# Patient Record
Sex: Male | Born: 1955 | Race: White | Hispanic: No | Marital: Married | Smoking: Never smoker
Health system: Southern US, Community
[De-identification: ages and names within clinical notes are randomized; demographics above are authoritative.]

## PROBLEM LIST (undated history)

## (undated) DIAGNOSIS — K429 Umbilical hernia without obstruction or gangrene: Secondary | ICD-10-CM

## (undated) DIAGNOSIS — Z86010 Personal history of colonic polyps: Secondary | ICD-10-CM

## (undated) DIAGNOSIS — Z8616 Personal history of COVID-19: Secondary | ICD-10-CM

## (undated) DIAGNOSIS — I10 Essential (primary) hypertension: Secondary | ICD-10-CM

## (undated) DIAGNOSIS — K648 Other hemorrhoids: Secondary | ICD-10-CM

## (undated) DIAGNOSIS — D649 Anemia, unspecified: Secondary | ICD-10-CM

## (undated) DIAGNOSIS — Z860101 Personal history of adenomatous and serrated colon polyps: Secondary | ICD-10-CM

## (undated) DIAGNOSIS — K5790 Diverticulosis of intestine, part unspecified, without perforation or abscess without bleeding: Secondary | ICD-10-CM

## (undated) DIAGNOSIS — K649 Unspecified hemorrhoids: Secondary | ICD-10-CM

## (undated) DIAGNOSIS — K642 Third degree hemorrhoids: Secondary | ICD-10-CM

## (undated) DIAGNOSIS — E785 Hyperlipidemia, unspecified: Secondary | ICD-10-CM

## (undated) DIAGNOSIS — K219 Gastro-esophageal reflux disease without esophagitis: Secondary | ICD-10-CM

## (undated) DIAGNOSIS — D126 Benign neoplasm of colon, unspecified: Secondary | ICD-10-CM

## (undated) DIAGNOSIS — K449 Diaphragmatic hernia without obstruction or gangrene: Secondary | ICD-10-CM

## (undated) DIAGNOSIS — D509 Iron deficiency anemia, unspecified: Secondary | ICD-10-CM

## (undated) DIAGNOSIS — Z8719 Personal history of other diseases of the digestive system: Secondary | ICD-10-CM

## (undated) DIAGNOSIS — E7800 Pure hypercholesterolemia, unspecified: Secondary | ICD-10-CM

## (undated) HISTORY — DX: Unspecified hemorrhoids: K64.9

## (undated) HISTORY — DX: Benign neoplasm of colon, unspecified: D12.6

## (undated) HISTORY — PX: HEMORRHOID BANDING: SHX5850

## (undated) HISTORY — DX: Pure hypercholesterolemia, unspecified: E78.00

## (undated) HISTORY — DX: Iron deficiency anemia, unspecified: D50.9

## (undated) HISTORY — PX: TONSILLECTOMY AND ADENOIDECTOMY: SUR1326

## (undated) HISTORY — DX: Essential (primary) hypertension: I10

## (undated) HISTORY — DX: Anemia, unspecified: D64.9

## (undated) HISTORY — DX: Diverticulosis of intestine, part unspecified, without perforation or abscess without bleeding: K57.90

## (undated) HISTORY — DX: Gastro-esophageal reflux disease without esophagitis: K21.9

## (undated) HISTORY — DX: Other hemorrhoids: K64.8

## (undated) HISTORY — PX: COLONOSCOPY WITH PROPOFOL: SHX5780

## (undated) HISTORY — PX: ESOPHAGOGASTRODUODENOSCOPY (EGD) WITH PROPOFOL: SHX5813

---

## 2004-12-15 HISTORY — PX: BACK SURGERY: SHX140

## 2004-12-15 HISTORY — PX: LUMBAR LAMINECTOMY: SHX95

## 2014-11-22 ENCOUNTER — Encounter: Payer: Self-pay | Admitting: Nurse Practitioner

## 2014-12-05 ENCOUNTER — Encounter: Payer: Self-pay | Admitting: *Deleted

## 2014-12-06 ENCOUNTER — Ambulatory Visit (INDEPENDENT_AMBULATORY_CARE_PROVIDER_SITE_OTHER): Payer: PRIVATE HEALTH INSURANCE | Admitting: Nurse Practitioner

## 2014-12-06 ENCOUNTER — Encounter: Payer: Self-pay | Admitting: Nurse Practitioner

## 2014-12-06 VITALS — BP 130/84 | HR 66 | Ht 71.5 in | Wt 219.8 lb

## 2014-12-06 DIAGNOSIS — D509 Iron deficiency anemia, unspecified: Secondary | ICD-10-CM

## 2014-12-06 DIAGNOSIS — K625 Hemorrhage of anus and rectum: Secondary | ICD-10-CM

## 2014-12-06 DIAGNOSIS — K648 Other hemorrhoids: Secondary | ICD-10-CM

## 2014-12-06 MED ORDER — HYDROCORTISONE ACETATE 25 MG RE SUPP: 25.0000 mg | Freq: Two times a day (BID) | RECTAL | Status: DC

## 2014-12-06 NOTE — Patient Instructions (Signed)
We sent a prescription to [facility], [address], [place], [place]. Marland Kitchen Anusol HC suppoitories., Use 1 suppository twice daily for 10 days.  Take a stool softner daily. Example:  Colace.

## 2014-12-08 DIAGNOSIS — K648 Other hemorrhoids: Secondary | ICD-10-CM | POA: Insufficient documentation

## 2014-12-08 DIAGNOSIS — K625 Hemorrhage of anus and rectum: Secondary | ICD-10-CM | POA: Insufficient documentation

## 2014-12-08 DIAGNOSIS — D509 Iron deficiency anemia, unspecified: Secondary | ICD-10-CM | POA: Insufficient documentation

## 2014-12-08 NOTE — Progress Notes (Signed)
HPI :  Patient is a 58 year old male referred by PCP at [facility] in [place] for rectal bleeding. He reports a normal colonoscopy in 2013 in [place].  Patient presents today for evaluation of recurrent painless rectal bleeding with BMs. He hasn't treated the hemorrhoids with anything. No constipation / diarrhea. No other GI complaints but has been having having some SOB and palpitations and scheduled for a stress test. His GI physician in [place] has now retired.   Labs December 2015: Hgb 11.7, MCV 80, ferritin 6.. Renal function normal    Past Medical History  Diagnosis Date  . Hemorrhoids     Onset approximately 1 yr ago  . Elevated cholesterol      Past Surgical History  Procedure Laterality Date  . Back surgery  2006  . Tonsillectomy and adenoidectomy      as a child    Family History  Problem Relation Age of Onset  . Diabetes Father   . Heart disease Mother   . Lupus Father     Antocoagulant disorder  . Colon cancer Neg Hx    History  Substance Use Topics  . Smoking status: Never Smoker   . Smokeless tobacco: Never Used  . Alcohol Use: 0.0 oz/week    0 Not specified per week     Comment: occ   Current Outpatient Prescriptions  Medication Sig Dispense Refill  . ferrous sulfate 325 (65 FE) MG tablet Take 325 mg by mouth 2 (two) times daily with a meal.    . hydrocortisone (ANUSOL-HC) 25 MG suppository Place 1 suppository (25 mg total) rectally 2 (two) times daily. 20 suppository 0   No current facility-administered medications for this visit.   No Known Allergies   Review of Systems: Fatigue, heart rhythm changes, shortness of breath. ll systems reviewed and negative except where noted in HPI.   Physical Exam: BP 130/84 mmHg  Pulse 66  Ht 5' 11.5" (1.816 m)  Wt 219 lb 12.8 oz (99.701 kg)  BMI 30.23 kg/m2 Constitutional: Pleasant,well-developed, white male in no acute distress. HEENT: Normocephalic and atraumatic. Conjunctivae are normal.  No scleral icterus. Neck supple.  Cardiovascular: Normal rate, regular rhythm.  Pulmonary/chest: Effort normal and breath sounds normal. No wheezing, rales or rhonchi. Abdominal: Soft, nondistended, nontender. Bowel sounds active throughout. There are no masses palpable. No hepatomegaly. Rectal: slightly narrow internal anal sphincter. On anoscopy there was an inflamed, friable mass, probably hemorrhoidal tissue.  Extremities: no edema Lymphadenopathy: No cervical adenopathy noted. Neurological: Alert and oriented to person place and time. Skin: Skin is warm and dry. No rashes noted. Psychiatric: Normal mood and affect. Behavior is normal.   ASSESSMENT AND PLAN:   20. 58 year old male with painless rectal bleeding with BM. Suspect bleeding is from internal hemorrhoids. On anoscopy was what appeared to be inflamed, friable hemorrhoid. Will treat with anusol HC supp bid for one week. Patient will return for brief follow up in a couple of weeks for reassessment. . In the interim will request colonoscopy reports from [place]. See #2  2. Mild Iron deficiency anemia of unclear etiology. .  Unusual, though not impossible for hemorrhoidal bleeding to cause iron deficiency.  Await colonoscopy report. Depending on results (and clinical course) patient may need repeat colonoscopy or at least a flex sig for reevaluation of the bleeding if stable from cardiac standpoing. If this does prove to  internal hemorrhoids patient may be a candidate for banding. Will discuss further at follow  up visit.    3. Palpitations / SOB. He is for stress test in near future.

## 2014-12-11 NOTE — Progress Notes (Signed)
Nevin Bloodgood, for iron deficiency anemia he ABSOLUTELY WILL need Colon / EGD to r/o significant pathology. Please arrange. Thank you

## 2014-12-26 ENCOUNTER — Telehealth: Payer: Self-pay | Admitting: *Deleted

## 2014-12-26 NOTE — Telephone Encounter (Signed)
I called and LM for this patient. Per Tye Savoy NP, it is time for him to have a colonoscopy and EGD due to his Iron Def anemia, His Ferritin level was low at 6.   I LM asking the patient to please call me back.

## 2014-12-27 NOTE — Telephone Encounter (Signed)
I called and spoke to the patient and he was fine with scheduling the EGD/COLON in the [facility]  With Dr. Henrene Pastor. He is scheduled for a previsit appt for 02-09-2015 at [place] / [facility] [address].  He is scheduled for the procedures in the [place] [facility] for 02-16-2015 at 11:00 am .

## 2015-02-09 ENCOUNTER — Ambulatory Visit (AMBULATORY_SURGERY_CENTER): Payer: Self-pay

## 2015-02-09 VITALS — Ht 72.0 in | Wt 222.0 lb

## 2015-02-09 DIAGNOSIS — D509 Iron deficiency anemia, unspecified: Secondary | ICD-10-CM

## 2015-02-09 MED ORDER — MOVIPREP 100 G PO SOLR: 1.0000 | Freq: Once | ORAL | Status: DC

## 2015-02-09 NOTE — Progress Notes (Signed)
No allergies to eggs or soy No home oxygen No diet/weight loss meds No past problems with anesthesia  No email

## 2015-02-12 ENCOUNTER — Telehealth: Payer: Self-pay | Admitting: Internal Medicine

## 2015-02-12 NOTE — Telephone Encounter (Signed)
Letter faxed per pt request.

## 2015-02-16 ENCOUNTER — Ambulatory Visit (AMBULATORY_SURGERY_CENTER): Payer: PRIVATE HEALTH INSURANCE | Admitting: Internal Medicine

## 2015-02-16 ENCOUNTER — Encounter: Payer: Self-pay | Admitting: Internal Medicine

## 2015-02-16 VITALS — BP 131/81 | HR 64 | Temp 97.7°F | Resp 23 | Ht 71.5 in | Wt 222.0 lb

## 2015-02-16 DIAGNOSIS — K2100 Gastro-esophageal reflux disease with esophagitis, without bleeding: Secondary | ICD-10-CM

## 2015-02-16 DIAGNOSIS — K625 Hemorrhage of anus and rectum: Secondary | ICD-10-CM

## 2015-02-16 DIAGNOSIS — D509 Iron deficiency anemia, unspecified: Secondary | ICD-10-CM

## 2015-02-16 DIAGNOSIS — K921 Melena: Secondary | ICD-10-CM

## 2015-02-16 MED ORDER — SODIUM CHLORIDE 0.9 % IV SOLN: 500.0000 mL | INTRAVENOUS | Status: DC

## 2015-02-16 MED ORDER — OMEPRAZOLE 20 MG PO CPDR: 20.0000 mg | DELAYED_RELEASE_CAPSULE | Freq: Every day | ORAL | Status: DC

## 2015-02-16 MED ORDER — ESOMEPRAZOLE MAGNESIUM 40 MG PO CPDR: 40.0000 mg | DELAYED_RELEASE_CAPSULE | Freq: Every day | ORAL | Status: AC

## 2015-02-16 NOTE — Op Note (Signed)
[place]  [address] [place], [id number]   ENDOSCOPY PROCEDURE REPORT  PATIENT: Corey Berry, Corey Berry  MR#: [id number] BIRTHDATE: [birth year] , 17  yrs. old GENDER: male ENDOSCOPIST: Eustace Quail, MD REFERRED BY:  [facility] in [place]/Office Self, PROCEDURE DATE:  02/16/2015 PROCEDURE:  EGD, diagnostic ASA CLASS:     Class II INDICATIONS:  iron deficiency anemia. MEDICATIONS: Monitored anesthesia care and Propofol 130 mg IV TOPICAL ANESTHETIC: none  DESCRIPTION OF PROCEDURE: After the risks benefits and alternatives of the procedure were thoroughly explained, informed consent was obtained.  The LB [id number] endoscope was introduced through the mouth and advanced to the second portion of the duodenum , Without limitations.  The instrument was slowly withdrawn as the mucosa was fully examined.    EXAM:The esophagus revealed erosive esophagitis in the distal portion (see photograph).  No Barrett's.  Stomach was normal.  The duodenum was normal.  Retroflexed views revealed a hiatal hernia. The scope was then withdrawn from the patient and the procedure completed.  COMPLICATIONS: There were no immediate complications.  ENDOSCOPIC IMPRESSION: 1. GERD with erosive esophagitis 2. Otherwise normal EGD  RECOMMENDATIONS: 1.  Anti-reflux regimen to be followed (information provided by endoscopy nurse today) 2.  Prescribe omeprazole 20 mg daily; #30; 11 refills 3. Return to the care of your primary physicians  REPEAT EXAM:  eSigned:  Eustace Quail, MD 02/16/2015 11:27 AM    CC:The Patient  ; [facility] in [place]

## 2015-02-16 NOTE — Op Note (Signed)
[place]  [address]. [place], [id number]   COLONOSCOPY PROCEDURE REPORT  PATIENT: Corey Berry, Corey Berry  MR#: [id number] BIRTHDATE: [birth year] , 49  yrs. old GENDER: male ENDOSCOPIST: Eustace Quail, MD REFERRED BY:.  [facility] in [place] / Office PROCEDURE DATE:  02/16/2015 PROCEDURE:   Colonoscopy, diagnostic First Screening Colonoscopy - Avg.  risk and is 50 yrs.  old or older - No.  Prior Negative Screening - Now for repeat screening. N/A  History of Adenoma - Now for follow-up colonoscopy & has been > or = to 3 yrs.  N/A  Polyps Removed Today? No.  Recommend repeat exam, <10 yrs? No. ASA CLASS:   Class II INDICATIONS:rectal bleeding and iron deficiency anemia.prior colonoscopies in [place] in 2013 were normal MEDICATIONS: Monitored anesthesia care and Propofol 270 mg IV  DESCRIPTION OF PROCEDURE:   After the risks benefits and alternatives of the procedure were thoroughly explained, informed consent was obtained.  The digital rectal exam revealed external hemorrhoids.   The LB [id number]  endoscope was introduced through the anus and advanced to the cecum, which was identified by both the appendix and ileocecal valve. No adverse events experienced.   The quality of the prep was excellent.  The instrument was then slowly withdrawn as the colon was fully examined.     COLON FINDINGS: There was moderate diverticulosis noted in the sigmoid colon.   The examination was otherwise normal.  Retroflexed views revealed internal hemorrhoids. The time to cecum=1 minutes 37 seconds.  Withdrawal time=8 minutes 30 seconds.  The scope was withdrawn and the procedure completed. COMPLICATIONS: There were no immediate complications.  ENDOSCOPIC IMPRESSION: 1.   Moderate diverticulosis was noted in the sigmoid colon 2.   The examination was otherwise normal 3.  External hemorrhoids as cause for rectal bleeding  RECOMMENDATIONS: 1.  Continue  current colorectal screening recommendations for "routine risk" patients with a repeat colonoscopy in 10 years. 2.  Upper endoscopy today (please see report)  eSigned:  Eustace Quail, MD 02/16/2015 11:23 AM   cc: The Patient    , [facility] in [place]

## 2015-02-16 NOTE — Progress Notes (Signed)
To recovery, report to [facility], RN. VSS.

## 2015-02-16 NOTE — Patient Instructions (Signed)
Discharge instructions given. Handouts on diverticulosis,hemorrhoids and GERD. Resume previous medications. Prescriptions sent into pharmacy. YOU HAD AN ENDOSCOPIC PROCEDURE TODAY AT [facility]:   Refer to the procedure report that was given to you for any specific questions about what was found during the examination.  If the procedure report does not answer your questions, please call your gastroenterologist to clarify.  If you requested that your care partner not be given the details of your procedure findings, then the procedure report has been included in a sealed envelope for you to review at your convenience later.  YOU SHOULD EXPECT: Some feelings of bloating in the abdomen. Passage of more gas than usual.  Walking can help get rid of the air that was put into your GI tract during the procedure and reduce the bloating. If you had a lower endoscopy (such as a colonoscopy or flexible sigmoidoscopy) you may notice spotting of blood in your stool or on the toilet paper. If you underwent a bowel prep for your procedure, you may not have a normal bowel movement for a few days.  Please Note:  You might notice some irritation and congestion in your nose or some drainage.  This is from the oxygen used during your procedure.  There is no need for concern and it should clear up in a day or so.  SYMPTOMS TO REPORT IMMEDIATELY:   Following lower endoscopy (colonoscopy or flexible sigmoidoscopy):  Excessive amounts of blood in the stool  Significant tenderness or worsening of abdominal pains  Swelling of the abdomen that is new, acute  Fever of 100F or higher   Following upper endoscopy (EGD)  Vomiting of blood or coffee ground material  New chest pain or pain under the shoulder blades  Painful or persistently difficult swallowing  New shortness of breath  Fever of 100F or higher  Black, tarry-looking stools  For urgent or emergent issues, a gastroenterologist can be reached  at any hour by calling [phone].   DIET: Your first meal following the procedure should be a small meal and then it is ok to progress to your normal diet. Heavy or fried foods are harder to digest and may make you feel nauseous or bloated.  Likewise, meals heavy in dairy and vegetables can increase bloating.  Drink plenty of fluids but you should avoid alcoholic beverages for 24 hours.  ACTIVITY:  You should plan to take it easy for the rest of today and you should NOT DRIVE or use heavy machinery until tomorrow (because of the sedation medicines used during the test).    FOLLOW UP: Our staff will call the number listed on your records the next business day following your procedure to check on you and address any questions or concerns that you may have regarding the information given to you following your procedure. If we do not reach you, we will leave a message.  However, if you are feeling well and you are not experiencing any problems, there is no need to return our call.  We will assume that you have returned to your regular daily activities without incident.  If any biopsies were taken you will be contacted by phone or by letter within the next 1-3 weeks.  Please call us at [phone] if you have not heard about the biopsies in 3 weeks.    SIGNATURES/CONFIDENTIALITY: You and/or your care partner have signed paperwork which will be entered into your electronic medical record.  These signatures attest to  the fact that that the information above on your After Visit Summary has been reviewed and is understood.  Full responsibility of the confidentiality of this discharge information lies with you and/or your care-partner.

## 2015-02-19 ENCOUNTER — Telehealth: Payer: Self-pay

## 2015-02-19 NOTE — Telephone Encounter (Signed)
  Follow up Call-  Call back number 02/16/2015  Post procedure Call Back phone  # [phone] or [phone]  Permission to leave phone message Yes     Patient questions:  Do you have a fever, pain , or abdominal swelling? No. Pain Score  0 *  Have you tolerated food without any problems? Yes.    Have you been able to return to your normal activities? Yes.    Do you have any questions about your discharge instructions: Diet   No. Medications  No. Follow up visit  No.  Do you have questions or concerns about your Care? No.  Actions: * If pain score is 4 or above: No action needed, pain <4.

## 2020-10-22 ENCOUNTER — Encounter: Payer: Self-pay | Admitting: *Deleted

## 2020-10-23 ENCOUNTER — Encounter: Payer: Self-pay | Admitting: Nurse Practitioner

## 2020-10-23 ENCOUNTER — Ambulatory Visit: Payer: Federal, State, Local not specified - PPO | Admitting: Nurse Practitioner

## 2020-10-23 ENCOUNTER — Other Ambulatory Visit (INDEPENDENT_AMBULATORY_CARE_PROVIDER_SITE_OTHER): Payer: Federal, State, Local not specified - PPO

## 2020-10-23 VITALS — BP 140/80 | HR 83 | Ht 72.0 in | Wt 215.0 lb

## 2020-10-23 DIAGNOSIS — K648 Other hemorrhoids: Secondary | ICD-10-CM

## 2020-10-23 DIAGNOSIS — K644 Residual hemorrhoidal skin tags: Secondary | ICD-10-CM

## 2020-10-23 DIAGNOSIS — K625 Hemorrhage of anus and rectum: Secondary | ICD-10-CM

## 2020-10-23 DIAGNOSIS — D649 Anemia, unspecified: Secondary | ICD-10-CM

## 2020-10-23 LAB — FERRITIN: Ferritin: 5.7 ng/mL — ABNORMAL LOW (ref 22.0–322.0)

## 2020-10-23 MED ORDER — PLENVU 140 G PO SOLR: 1.0000 | Freq: Once | ORAL | 0 refills | Status: AC

## 2020-10-23 MED ORDER — HYDROCORTISONE (PERIANAL) 2.5 % EX CREA: 1.0000 "application " | TOPICAL_CREAM | Freq: Every day | CUTANEOUS | 1 refills | Status: AC

## 2020-10-23 NOTE — Patient Instructions (Signed)
If you are age 62 or older, your body mass index should be between 23-30. Your Body mass index is 29.16 kg/m. If this is out of the aforementioned range listed, please consider follow up with your Primary Care Provider.  If you are age 60 or younger, your body mass index should be between 19-25. Your Body mass index is 29.16 kg/m. If this is out of the aformentioned range listed, please consider follow up with your Primary Care Provider.   Your provider has requested that you go to the basement level for lab work before leaving today. Press "B" on the elevator. The lab is located at the first door on the left as you exit the elevator.   We have sent the following medications to your pharmacy for you to pick up at your convenience: Hydrocortisone cream apply small amount inside rectum nightly for 10 days.  You have been scheduled for a colonoscopy. Please follow written instructions given to you at your visit today.  Please pick up your prep supplies at the pharmacy within the next 1-3 days. If you use inhalers (even only as needed), please bring them with you on the day of your procedure.  Due to recent changes in healthcare laws, you may see the results of your imaging and laboratory studies on MyChart before your provider has had a chance to review them.  We understand that in some cases there may be results that are confusing or concerning to you. Not all laboratory results come back in the same time frame and the provider may be waiting for multiple results in order to interpret others.  Please give [place] 48 hours in order for your provider to thoroughly review all the results before contacting the office for clarification of your results.

## 2020-10-23 NOTE — Progress Notes (Signed)
ASSESSMENT AND PLAN    # Recurrent painless rectal bleeding.  Bleeding started again two years ago. Last colonoscopy was in 2016 ( for bleeding and anemia). Bleeding probably hemorrhoidal but need to exclude colon lesions which may have developed since last colonoscopy.  --Will schedule for colonoscopy. If bleeding turns out to be from internal hemorrhoids then consider hemorrhoidal banding at a later date. Patient will be scheduled for a colonoscopy. The risks and benefits of colonoscopy with possible polypectomy / biopsies were discussed and the patient agrees to proceed.  --Protruding internal hemorrhoids and external hemorrhoids on exam. Trial of Anusol cream PR Q HS x 10 nights.   # Hx of iron deficiency anemia (remote). Patient says he was recently diagnosed with recurrent anemia and started on oral iron but only taking about one tablet a week.  --Hgb 10.5 ( PCP office) mid October. Will check TIBC, Ferritin.   #GERD, asymptomatic on daily Nexium  HISTORY OF PRESENT ILLNESS     Primary Gastroenterologist :Scarlette Shorts, MD   Chief Complaint : rectal bleeding   Corey Berry is a 64 y.o. male with PMH / Routt significant for,  but not necessarily limited to: Hypertension, obesity, hyperlipidemia, GERD, hemorrhoids, iron deficiency anemia.    I saw patient in late 2015 for rectal bleeding felt to be hemorrhoidal. He was also noted to have IDA so EGD and colonoscopy carried out in March 2016. EGD notable for GERD with esophagitis. Colonoscopy showed diverticulosis and external hemorrhoids.   Patient has been referred back by PCP for evaluation for rectal bleeding. Patient says the bleeding we evaluated him for in 2015 stopped following the colonoscopy. For a few years he had no bleeding then the painless bleeding started again ~ 2 years ago.  Blood is red and described as a large amount at times. He notices blood with nearly every BM.  He bleeds more on days that he drinks coffee but  attributes that to coffee's laxative effect.  No constipation. No abdominal or rectal pain. Sometimes he has fecal seepage consisting of clear fluid and /or occasionally blood.   Data Reviewed: PCP labs 10/08/20 LFTs normal Hgb 10.5 Renal function normal   Previous Endoscopic Evaluations / Pertinent Studies:  March 2016 EGD and colonoscopy for rectal bleeding and IDA --GERD with esophagitis --Diverticulosis and external hemorrhoid  Past Medical History:  Diagnosis Date  . Anemia   . Elevated cholesterol   . Hemorrhoids    Onset approximately 1 yr ago  . Hypertension      Past Surgical History:  Procedure Laterality Date  . BACK SURGERY  2006  . TONSILLECTOMY AND ADENOIDECTOMY     as a child   Family History  Problem Relation Age of Onset  . Diabetes Father   . Lupus Father        Antocoagulant disorder  . Heart disease Mother   . Colon cancer Neg Hx   . Stomach cancer Neg Hx    Social History   Tobacco Use  . Smoking status: Never Smoker  . Smokeless tobacco: Never Used  Substance Use Topics  . Alcohol use: Yes    Alcohol/week: 0.0 standard drinks    Comment: occ  . Drug use: No   Current Outpatient Medications  Medication Sig Dispense Refill  . esomeprazole (NEXIUM) 40 MG capsule Take 1 capsule (40 mg total) by mouth daily at 6 (six) AM. 30 capsule 11  . ferrous sulfate 325 (65 FE) MG tablet Take 325  mg by mouth 2 (two) times daily with a meal.    . lisinopril (ZESTRIL) 10 MG tablet Take 10 mg by mouth daily.    . simvastatin (ZOCOR) 20 MG tablet Take 20 mg by mouth daily.     No current facility-administered medications for this visit.   No Known Allergies   Review of Systems: All systems reviewed and negative except where noted in HPI.   PHYSICAL EXAM :    Wt Readings from Last 3 Encounters:  10/23/20 215 lb (97.5 kg)  02/16/15 222 lb (100.7 kg)  02/09/15 222 lb (100.7 kg)    BP 140/80   Pulse 83   Ht 6' (1.829 m)   Wt 215 lb (97.5 kg)    BMI 29.16 kg/m  Constitutional:  Pleasant male in no acute distress. Psychiatric: Normal mood and affect. Behavior is normal. EENT: Pupils normal.  Conjunctivae are normal. No scleral icterus. Neck supple.  Cardiovascular: Normal rate, regular rhythm. No edema Pulmonary/chest: Effort normal and breath sounds normal. No wheezing, rales or rhonchi. Abdominal: Soft, nondistended, nontender. Bowel sounds active throughout. There are no masses palpable. No hepatomegaly. Rectal: inflamed protruding internal hemorrhoids. Mildly inflamed external hemorrhoids.  Neurological: Alert and oriented to person place and time. Skin: Skin is warm and dry. No rashes noted.  Tye Savoy, NP  10/23/2020, 2:08 PM  Cc:  Referring Provider Etter Sjogren, FNP

## 2020-10-23 NOTE — Progress Notes (Signed)
Assessment and plans reviewed  

## 2020-10-24 ENCOUNTER — Telehealth: Payer: Self-pay | Admitting: Nurse Practitioner

## 2020-10-24 LAB — IRON, TOTAL/TOTAL IRON BINDING CAP
%SAT: 3 % (calc) — ABNORMAL LOW (ref 20–48)
Iron: 12 ug/dL — ABNORMAL LOW (ref 50–180)
TIBC: 380 mcg/dL (calc) (ref 250–425)

## 2020-10-29 ENCOUNTER — Encounter: Payer: Self-pay | Admitting: Internal Medicine

## 2020-11-05 ENCOUNTER — Other Ambulatory Visit: Payer: Self-pay

## 2020-11-05 ENCOUNTER — Encounter: Payer: Self-pay | Admitting: Internal Medicine

## 2020-11-05 ENCOUNTER — Ambulatory Visit (AMBULATORY_SURGERY_CENTER): Payer: Federal, State, Local not specified - PPO | Admitting: Internal Medicine

## 2020-11-05 VITALS — BP 120/72 | HR 67 | Temp 97.3°F | Resp 14 | Ht 72.0 in | Wt 215.0 lb

## 2020-11-05 DIAGNOSIS — D125 Benign neoplasm of sigmoid colon: Secondary | ICD-10-CM | POA: Diagnosis not present

## 2020-11-05 DIAGNOSIS — K5730 Diverticulosis of large intestine without perforation or abscess without bleeding: Secondary | ICD-10-CM

## 2020-11-05 DIAGNOSIS — K625 Hemorrhage of anus and rectum: Secondary | ICD-10-CM | POA: Diagnosis present

## 2020-11-05 DIAGNOSIS — D122 Benign neoplasm of ascending colon: Secondary | ICD-10-CM

## 2020-11-05 DIAGNOSIS — K648 Other hemorrhoids: Secondary | ICD-10-CM

## 2020-11-05 MED ORDER — SODIUM CHLORIDE 0.9 % IV SOLN: 500.0000 mL | Freq: Once | INTRAVENOUS | Status: DC

## 2020-11-05 NOTE — Op Note (Signed)
[facility] Patient Name: Corey Berry Procedure Date: 11/05/2020 8:05 AM MRN: [id number] Endoscopist: Docia Chuck. Henrene Pastor , MD Age: 64 Referring MD:  Date of Birth: [birth year] Gender: Male Account #: [id number] Procedure:                Colonoscopy with cold snare polypectomy x 2 Indications:              Rectal bleeding, Iron deficiency anemia. Prior                            colonoscopy examinations 2009, 2013, 2016 all                            negative for neoplasia Medicines:                Monitored Anesthesia Care Procedure:                Pre-Anesthesia Assessment:                           - Prior to the procedure, a History and Physical                            was performed, and patient medications and                            allergies were reviewed. The patient's tolerance of                            previous anesthesia was also reviewed. The risks                            and benefits of the procedure and the sedation                            options and risks were discussed with the patient.                            All questions were answered, and informed consent                            was obtained. Prior Anticoagulants: The patient has                            taken no previous anticoagulant or antiplatelet                            agents. ASA Grade Assessment: II - A patient with                            mild systemic disease. After reviewing the risks                            and benefits, the patient was deemed in  satisfactory condition to undergo the procedure.                           After obtaining informed consent, the colonoscope                            was passed under direct vision. Throughout the                            procedure, the patient's blood pressure, pulse, and                            oxygen saturations were monitored continuously. The                            Colonoscope  was introduced through the anus and                            advanced to the the cecum, identified by                            appendiceal orifice and ileocecal valve. The                            terminal ileum, ileocecal valve, appendiceal                            orifice, and rectum were photographed. The quality                            of the bowel preparation was excellent. The                            colonoscopy was performed without difficulty. The                            patient tolerated the procedure well. The bowel                            preparation used was SUPREP via split dose                            instruction. Scope In: 8:21:55 AM Scope Out: 8:34:30 AM Scope Withdrawal Time: 0 hours 11 minutes 1 second  Total Procedure Duration: 0 hours 12 minutes 35 seconds  Findings:                 The terminal ileum appeared normal.                           Two polyps were found in the sigmoid colon and                            ascending colon. The polyps were 3 to 4 mm in size.  These polyps were removed with a cold snare.                            Resection and retrieval were complete.                           Multiple diverticula were found in the left colon.                           Internal hemorrhoids were found during                            retroflexion. The hemorrhoids were moderate.                           The exam was otherwise without abnormality on                            direct and retroflexion views. Complications:            No immediate complications. Estimated blood loss:                            None. Estimated Blood Loss:     Estimated blood loss: none. Impression:               - Two 3 to 4 mm polyps in the sigmoid colon and in                            the ascending colon, removed with a cold snare.                            Resected and retrieved.                           - Diverticulosis in  the left colon.                           - Internal hemorrhoids. Normal terminal ileum.                           - The examination was otherwise normal on direct                            and retroflexion views. Recommendation:           - Repeat colonoscopy in 7-10 years for surveillance.                           - Patient has a contact number available for                            emergencies. The signs and symptoms of potential                            delayed complications were discussed with the  patient. Return to normal activities tomorrow.                            Written discharge instructions were provided to the                            patient.                           - Resume previous diet.                           - Continue present medications.                           - Await pathology results. Docia Chuck. Henrene Pastor, MD 11/05/2020 8:51:11 AM This report has been signed electronically.

## 2020-11-05 NOTE — Patient Instructions (Addendum)
Read all of the handouts given to  You by your recovery room nurse.  YOU HAD AN ENDOSCOPIC PROCEDURE TODAY AT [facility]:   Refer to the procedure report that was given to you for any specific questions about what was found during the examination.  If the procedure report does not answer your questions, please call your gastroenterologist to clarify.  If you requested that your care partner not be given the details of your procedure findings, then the procedure report has been included in a sealed envelope for you to review at your convenience later.  YOU SHOULD EXPECT: Some feelings of bloating in the abdomen. Passage of more gas than usual.  Walking can help get rid of the air that was put into your GI tract during the procedure and reduce the bloating. If you had a lower endoscopy (such as a colonoscopy or flexible sigmoidoscopy) you may notice spotting of blood in your stool or on the toilet paper. If you underwent a bowel prep for your procedure, you may not have a normal bowel movement for a few days.  Please Note:  You might notice some irritation and congestion in your nose or some drainage.  This is from the oxygen used during your procedure.  There is no need for concern and it should clear up in a day or so.  SYMPTOMS TO REPORT IMMEDIATELY:   Following lower endoscopy (colonoscopy or flexible sigmoidoscopy):  Excessive amounts of blood in the stool  Significant tenderness or worsening of abdominal pains  Swelling of the abdomen that is new, acute  Fever of 100F or higher   For urgent or emergent issues, a gastroenterologist can be reached at any hour by calling [phone]. Do not use MyChart messaging for urgent concerns.    DIET:  We do recommend a small meal at first, but then you may proceed to your regular diet.  Drink plenty of fluids but you should avoid alcoholic beverages for 24 hours. Try to increase the fiber in your diet, and drink plenty of water.   Thank-you for choosing [place] for your healthcare needs today.  ACTIVITY:  You should plan to take it easy for the rest of today and you should NOT DRIVE or use heavy machinery until tomorrow (because of the sedation medicines used during the test).    FOLLOW UP: Our staff will call the number listed on your records 48-72 hours following your procedure to check on you and address any questions or concerns that you may have regarding the information given to you following your procedure. If we do not reach you, we will leave a message.  We will attempt to reach you two times.  During this call, we will ask if you have developed any symptoms of COVID 19. If you develop any symptoms (ie: fever, flu-like symptoms, shortness of breath, cough etc.) before then, please call [phone].  If you test positive for Covid 19 in the 2 weeks post procedure, please call and report this information to [place].    If any biopsies were taken you will be contacted by phone or by letter within the next 1-3 weeks.  Please call us at [phone] if you have not heard about the biopsies in 3 weeks.    SIGNATURES/CONFIDENTIALITY: You and/or your care partner have signed paperwork which will be entered into your electronic medical record.  These signatures attest to the fact that that the information above on your After Visit Summary has been  reviewed and is understood.  Full responsibility of the confidentiality of this discharge information lies with you and/or your care-partner.

## 2020-11-05 NOTE — Progress Notes (Signed)
VS done by CW 

## 2020-11-05 NOTE — Progress Notes (Signed)
PT taken to PACU. Monitors in place. VSS. Report given to RN. 

## 2020-11-06 ENCOUNTER — Telehealth: Payer: Self-pay

## 2020-11-06 NOTE — Telephone Encounter (Signed)
°  Follow up Call-  Call back number 11/05/2020  Post procedure Call Back phone  # [phone]  Permission to leave phone message Yes  Some recent data might be hidden     Patient questions:  Do you have a fever, pain , or abdominal swelling? No. Pain Score  0 *  Have you tolerated food without any problems? Yes.    Have you been able to return to your normal activities? Yes.    Do you have any questions about your discharge instructions: Diet   No. Medications  No. Follow up visit  No.  Do you have questions or concerns about your Care? No.  Actions: * If pain score is 4 or above: No action needed, pain <4.  1. Have you developed a fever since your procedure? no  2.   Have you had an respiratory symptoms (SOB or cough) since your procedure? no  3.   Have you tested positive for COVID 19 since your procedure no  4.   Have you had any family members/close contacts diagnosed with the COVID 19 since your procedure?  no   If yes to any of these questions please route to Joylene John, RN and Joella Prince, RN

## 2020-11-12 ENCOUNTER — Encounter: Payer: Self-pay | Admitting: Internal Medicine

## 2020-11-13 ENCOUNTER — Telehealth: Payer: Self-pay | Admitting: Internal Medicine

## 2020-11-13 NOTE — Telephone Encounter (Signed)
Pt is requesting a call back from a nurse to discuss his pathology results. Pt would like to also discuss a referral to a surgery referral In regards to his external hemorrhoids.

## 2020-11-14 ENCOUNTER — Encounter: Payer: Self-pay | Admitting: *Deleted

## 2020-11-14 NOTE — Telephone Encounter (Signed)
Vaughan Basta, okay for banding visits JP, thanks [place]

## 2020-11-14 NOTE — Telephone Encounter (Signed)
Pt scheduled for hem banding #1 tomorrow with Pyrtle, pt aware of appt.

## 2020-11-14 NOTE — Telephone Encounter (Signed)
Result letter discussed with pt. Pt states he wants to have his internal hemorrhoids and external hemorrhoids removed. He would like a referral from Dr. Henrene Pastor. Please advise.

## 2020-11-14 NOTE — Telephone Encounter (Signed)
Patient has significant chronic rectal bleeding due to moderate internal hemorrhoids.  I advise an appointment with Dr. Hilarie Fredrickson for an in office banding procedure(s) (I discussed this with the patient at the time of colonoscopy and provided him with a brochure to review).  Thank you

## 2020-11-15 ENCOUNTER — Encounter: Payer: Self-pay | Admitting: Internal Medicine

## 2020-11-15 ENCOUNTER — Ambulatory Visit: Payer: Federal, State, Local not specified - PPO | Admitting: Internal Medicine

## 2020-11-15 VITALS — HR 85 | Ht 72.0 in | Wt 218.0 lb

## 2020-11-15 DIAGNOSIS — K648 Other hemorrhoids: Secondary | ICD-10-CM | POA: Diagnosis not present

## 2020-11-15 NOTE — Patient Instructions (Addendum)
Please purchase the following medications over the counter and take as directed: Recticare-Apply to rectum as needed  You have been scheduled for your 2nd hemorrhoidal banding on 12/303/21 at 11:20 am.  If you are age 61 or older, your body mass index should be between 23-30. Your Body mass index is 29.57 kg/m. If this is out of the aforementioned range listed, please consider follow up with your Primary Care Provider.  If you are age 38 or younger, your body mass index should be between 19-25. Your Body mass index is 29.57 kg/m. If this is out of the aformentioned range listed, please consider follow up with your Primary Care Provider.   Due to recent changes in healthcare laws, you may see the results of your imaging and laboratory studies on MyChart before your provider has had a chance to review them.  We understand that in some cases there may be results that are confusing or concerning to you. Not all laboratory results come back in the same time frame and the provider may be waiting for multiple results in order to interpret others.  Please give [place] 48 hours in order for your provider to thoroughly review all the results before contacting the office for clarification of your results.   HEMORRHOID BANDING PROCEDURE    FOLLOW-UP CARE   1. The procedure you have had should have been relatively painless since the banding of the area involved does not have nerve endings and there is no pain sensation.  The rubber band cuts off the blood supply to the hemorrhoid and the band may fall off as soon as 48 hours after the banding (the band may occasionally be seen in the toilet bowl following a bowel movement). You may notice a temporary feeling of fullness in the rectum which should respond adequately to plain Tylenol or Motrin.  2. Following the banding, avoid strenuous exercise that evening and resume full activity the next day.  A sitz bath (soaking in a warm tub) or bidet is soothing, and can  be useful for cleansing the area after bowel movements.     3. To avoid constipation, take two tablespoons of natural wheat bran, natural oat bran, flax, Benefiber or any over the counter fiber supplement and increase your water intake to 7-8 glasses daily.    4. Unless you have been prescribed anorectal medication, do not put anything inside your rectum for two weeks: No suppositories, enemas, fingers, etc.  5. Occasionally, you may have more bleeding than usual after the banding procedure.  This is often from the untreated hemorrhoids rather than the treated one.  Don't be concerned if there is a tablespoon or so of blood.  If there is more blood than this, lie flat with your bottom higher than your head and apply an ice pack to the area. If the bleeding does not stop within a half an hour or if you feel faint, call our office at [phone] or go to the emergency room.  6. Problems are not common; however, if there is a substantial amount of bleeding, severe pain, chills, fever or difficulty passing urine (very rare) or other problems, you should call us at [phone] or report to the nearest emergency room.  7. Do not stay seated continuously for more than 2-3 hours for a day or two after the procedure.  Tighten your buttock muscles 10-15 times every two hours and take 10-15 deep breaths every 1-2 hours.  Do not spend more than  a few minutes on the toilet if you cannot empty your bowel; instead re-visit the toilet at a later time.

## 2020-11-16 NOTE — Progress Notes (Signed)
64 year old patient of Dr. Henrene Pastor who is seen to consider hemorrhoidal banding for symptomatic internal hemorrhoids  Longstanding internal hemorrhoid seen recently at colonoscopy on 11/05/2020. Frequent rectal bleeding with bowel movement, prolapse symptom and fecal smearing Bowel movements are regular, occasionally loose never constipated   PROCEDURE NOTE:  The patient presents with symptomatic grade 2-3 internal hemorrhoids, requesting rubber band ligation of his hemorrhoidal disease.  All risks, benefits and alternative forms of therapy were described and informed consent was obtained.   The anorectum was pre-medicated with 0.125% nitroglycerin ointment and RectiCare The decision was made to band the LL internal hemorrhoid, and the [place] was used to perform band ligation without complication.   Digital anorectal examination was then performed to assure proper positioning of the band, and to adjust the banded tissue as required.  The patient was discharged home without pain or other issues.  Dietary and behavioral recommendations were given and along with follow-up instructions.     The following adjunctive treatments were recommended: OTC RectiCare per box instruction  The patient will return as scheduled for follow-up and possible additional banding as required. No complications were encountered and the patient tolerated the procedure well.

## 2020-11-21 NOTE — Telephone Encounter (Signed)
No additional notes needed  

## 2020-12-13 ENCOUNTER — Encounter: Payer: Federal, State, Local not specified - PPO | Admitting: Internal Medicine

## 2020-12-18 ENCOUNTER — Emergency Department (HOSPITAL_COMMUNITY)
Admission: EM | Admit: 2020-12-18 | Discharge: 2020-12-18 | Disposition: A | Discharge status: Home / Self Care | Payer: Federal, State, Local not specified - PPO | Attending: Emergency Medicine | Admitting: Emergency Medicine

## 2020-12-18 ENCOUNTER — Other Ambulatory Visit: Payer: Self-pay

## 2020-12-18 ENCOUNTER — Encounter (HOSPITAL_COMMUNITY): Payer: Self-pay

## 2020-12-18 DIAGNOSIS — K429 Umbilical hernia without obstruction or gangrene: Secondary | ICD-10-CM | POA: Insufficient documentation

## 2020-12-18 DIAGNOSIS — Z5321 Procedure and treatment not carried out due to patient leaving prior to being seen by health care provider: Secondary | ICD-10-CM | POA: Diagnosis not present

## 2020-12-18 NOTE — ED Triage Notes (Signed)
Pt reports umbiblical hernia for years but for the past 3 days he has had increased pain, swelling and redness to the area. Denies any blood in his stool, nausea or vomiting.

## 2020-12-18 NOTE — ED Notes (Signed)
Pt stated that he could get seen at [facility] at 9:30 in the morning so he was going to leave.

## 2020-12-19 ENCOUNTER — Other Ambulatory Visit (INDEPENDENT_AMBULATORY_CARE_PROVIDER_SITE_OTHER): Payer: Federal, State, Local not specified - PPO

## 2020-12-19 ENCOUNTER — Ambulatory Visit: Payer: Federal, State, Local not specified - PPO | Admitting: Gastroenterology

## 2020-12-19 ENCOUNTER — Encounter: Payer: Self-pay | Admitting: Gastroenterology

## 2020-12-19 ENCOUNTER — Ambulatory Visit (HOSPITAL_COMMUNITY)
Admission: RE | Admit: 2020-12-19 | Discharge: 2020-12-19 | Disposition: A | Discharge status: Home / Self Care | Payer: Federal, State, Local not specified - PPO | Source: Ambulatory Visit | Attending: Gastroenterology | Admitting: Gastroenterology

## 2020-12-19 VITALS — BP 140/90 | HR 80 | Ht 69.0 in | Wt 217.2 lb

## 2020-12-19 DIAGNOSIS — K429 Umbilical hernia without obstruction or gangrene: Secondary | ICD-10-CM

## 2020-12-19 DIAGNOSIS — R1033 Periumbilical pain: Secondary | ICD-10-CM | POA: Insufficient documentation

## 2020-12-19 LAB — BASIC METABOLIC PANEL
BUN: 14 mg/dL (ref 6–23)
CO2: 28 mEq/L (ref 19–32)
Calcium: 9.3 mg/dL (ref 8.4–10.5)
Chloride: 105 mEq/L (ref 96–112)
Creatinine, Ser: 1.01 mg/dL (ref 0.40–1.50)
GFR: 79.47 mL/min (ref >60.00)
Glucose, Bld: 112 mg/dL — ABNORMAL HIGH (ref 70–99)
Potassium: 4.3 mEq/L (ref 3.5–5.1)
Sodium: 139 mEq/L (ref 135–145)

## 2020-12-19 LAB — COMPREHENSIVE METABOLIC PANEL
ALT: 16 U/L (ref 0–53)
AST: 15 U/L (ref 0–37)
Albumin: 4.5 g/dL (ref 3.5–5.2)
Alkaline Phosphatase: 60 U/L (ref 39–117)
BUN: 14 mg/dL (ref 6–23)
CO2: 28 mEq/L (ref 19–32)
Calcium: 9.3 mg/dL (ref 8.4–10.5)
Chloride: 105 mEq/L (ref 96–112)
Creatinine, Ser: 1.01 mg/dL (ref 0.40–1.50)
GFR: 79.47 mL/min (ref >60.00)
Glucose, Bld: 112 mg/dL — ABNORMAL HIGH (ref 70–99)
Potassium: 4.3 mEq/L (ref 3.5–5.1)
Sodium: 139 mEq/L (ref 135–145)
Total Bilirubin: 0.5 mg/dL (ref 0.2–1.2)
Total Protein: 7.2 g/dL (ref 6.0–8.3)

## 2020-12-19 MED ORDER — IOHEXOL 300 MG/ML  SOLN
100.0000 mL | Freq: Once | INTRAMUSCULAR | Status: AC | PRN
  Administered 2020-12-19: 100 mL via INTRAVENOUS

## 2020-12-19 NOTE — Patient Instructions (Signed)
If you are age 63 or older, your body mass index should be between 23-30. Your Body mass index is 32.08 kg/m. If this is out of the aforementioned range listed, please consider follow up with your Primary Care Provider.  If you are age 12 or younger, your body mass index should be between 19-25. Your Body mass index is 32.08 kg/m. If this is out of the aformentioned range listed, please consider follow up with your Primary Care Provider.   You have been scheduled for a CT scan of the abdomen and pelvis at [facility], 1st floor Radiology. You are scheduled on 01/23/21  at now. You should arrive 15 minutes prior to your appointment time for registration.  Please pick up 2 bottles of contrast from [facility] at least 3 days prior to your scan. The solution may taste better if refrigerated, but do NOT add ice or any other liquid to this solution. Shake well before drinking.   Due to recent changes in healthcare laws, you may see the results of your imaging and laboratory studies on MyChart before your provider has had a chance to review them.  We understand that in some cases there may be results that are confusing or concerning to you. Not all laboratory results come back in the same time frame and the provider may be waiting for multiple results in order to interpret others.  Please give [place] 48 hours in order for your provider to thoroughly review all the results before contacting the office for clarification of your results.    Thank you for choosing me and [facility].  Doug Sou, PA

## 2020-12-19 NOTE — Progress Notes (Signed)
Addendum: Reviewed and agree with assessment and management plan. Theona Muhs M, MD  

## 2020-12-19 NOTE — Progress Notes (Signed)
12/19/2020 Corey Berry [id number] Oct 19, 1956   HISTORY OF PRESENT ILLNESS:  This is a pleasant 65 year old male who is a patient of Dr. Lauro Franklin.  He follows here for colonoscopies and hemorrhoid bandings.  He is here today as an urgent add-on for complaints of umbilical hernia with redness and pain.  He says that he's had the hernia for years but never had pain and redness in the past.  Says that it has been red for the past several days and had some shooting pains.  He went to the ED at [facility] yesterday but the wait was 10-15 hours so he did not wait to be seen.  Today it is better.  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  No fever, vomiting, etc.   Past Medical History:  Diagnosis Date  . Anemia   . Diverticulosis   . Elevated cholesterol   . GERD (gastroesophageal reflux disease)   . Hemorrhoids    Onset approximately 1 yr ago  . Hypertension   . Internal hemorrhoids   . Iron deficiency anemia   . Tubular adenoma of colon    Past Surgical History:  Procedure Laterality Date  . BACK SURGERY  2006  . TONSILLECTOMY AND ADENOIDECTOMY     as a child    reports that he has never smoked. He has never used smokeless tobacco. He reports current alcohol use. He reports that he does not use drugs. family history includes Diabetes in his father; Heart disease in his mother; Lupus in his father. No Known Allergies    Outpatient Encounter Medications as of 12/19/2020  Medication Sig  . esomeprazole (NEXIUM) 40 MG capsule Take 1 capsule (40 mg total) by mouth daily at 6 (six) AM.  . ferrous sulfate 325 (65 FE) MG tablet Take 325 mg by mouth 2 (two) times daily with a meal.  . hydrocortisone (ANUSOL-HC) 2.5 % rectal cream Place 1 application rectally at bedtime.  Marland Kitchen lisinopril (ZESTRIL) 10 MG tablet Take 10 mg by mouth daily.  . simvastatin (ZOCOR) 20 MG tablet Take 20 mg by mouth daily.   No facility-administered encounter medications on file as of  12/19/2020.     REVIEW OF SYSTEMS  : All other systems reviewed and negative except where noted in the History of Present Illness.   PHYSICAL EXAM: BP 140/90 (BP Location: Left Arm, Patient Position: Sitting, Cuff Size: Normal)   Pulse 80   Ht 5\' 9"  (1.753 m) Comment: height measured without shoes  Wt 217 lb 4 oz (98.5 kg)   BMI 32.08 kg/m  General: Well developed white male in no acute distress Head: Normocephalic and atraumatic Eyes:  Sclerae anicteric, conjunctiva pink. Ears: Normal auditory acuity Lungs: Clear throughout to auscultation; no W/R/R. Heart: Regular rate and rhythm; no M/R/G. Abdomen: Soft, non-distended.  BS present.  Non-tender.  Umbilical hernia noted without erythema or tenderness.  It is reducible. Musculoskeletal: Symmetrical with no gross deformities  Skin: No lesions on visible extremities Extremities: No edema  Neurological: Alert oriented x 4, grossly non-focal Psychological:  Alert and cooperative. Normal mood and affect  ASSESSMENT AND PLAN: *Umbilical hernia with pain and redness:  Reports pain in the area with a lot of redness the past several days.  It looks and feels fine today but he says that he has had this hernia for years and never had any issues like that.  I wonder if he has some impending  incarceration or strangulation issue.  Will plan for CT scan of the abdomen and pelvis with contrast and will refer to [facility].  BMP today.   CC:  Etter Sjogren, FNP

## 2020-12-21 ENCOUNTER — Telehealth: Payer: Self-pay | Admitting: Gastroenterology

## 2020-12-21 NOTE — Telephone Encounter (Signed)
No answer no voice mail  

## 2020-12-21 NOTE — Telephone Encounter (Signed)
Patients wife calling to let us know the appointment with [facility] will be 01/14/2021. 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

## 2020-12-21 NOTE — Telephone Encounter (Signed)
See my note from his CT scan about abx.  Does not need unless becomes very persistently red, painful, and has fever.  It was not red when I saw him in clinic so it was coming and going.  I would say that if he can avoid a lot of bending and heavy lifting at work that may be best for now if they can put him on light duty restrictions until he sees the Psychologist, sport and exercise.  Thank you,  Jess

## 2020-12-21 NOTE — Telephone Encounter (Signed)
Janett Billow please see note from the pt regarding [facility] appt.  Does he need any abx or restrictions for work in the meantime?

## 2020-12-21 NOTE — Telephone Encounter (Signed)
The pt wife has been advised that the pt will need abx if the area becomes red, painful or he develops a fever.  She has also been advised that he should be on light duty with no bending to heavy lifting.  She will call back with any further concerns or questions.

## 2021-03-04 ENCOUNTER — Ambulatory Visit (HOSPITAL_COMMUNITY): Payer: Self-pay | Admitting: General Surgery

## 2021-03-04 NOTE — H&P (View-Only) (Signed)
The patient is a 65 year old male who presents with hemorrhoids. 65 year old male who presented to the office for evaluation for a hernia and was noted to have hemorrhoid disease as well. He was scheduled for follow-up with me. He states that he has had trouble with hemorrhoids for the past year. He describes bleeding and pain and drainage. He has been seen at [facility] and colonoscopy was performed in December 2021. Several polyps were removed. Internal hemorrhoids were found. Rubber band placement was performed on a hemorrhoid, and he was scheduled for follow-up but did not complete this. Patient reports no constipation issues, but occasionally has to strain to have a bowel movement or urinate. He sits on the toilet for long periods of time. He also strains for work, as he is a Dealer.   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 Hydrocortisone (Perianal) (2.5% Cream, External) Active. Lisinopril (10MG  Tablet, Oral) Active. Sildenafil Citrate (100MG  Tablet, Oral) Active. Simvastatin (10MG  Tablet, Oral) Active. Plenvu (140GM For Solution, Oral) Active. Simvastatin (20MG  Tablet, Oral) Active. Medications Reconciled  Social History Leighton Ruff, MD; 01/24/4708 11:45 AM) Alcohol use Moderate alcohol use. Caffeine use Coffee. No drug use Tobacco use Never smoker.  Family History  Leighton Ruff, MD; 06/11/3661 11:45 AM) Arthritis Mother. Diabetes Mellitus Brother, Father. Heart Disease Father. Hypertension Father, Mother. Kidney Disease Father.  Other Problems Leighton Ruff, MD; 9/47/6546 11:45 AM) Back Pain Gastroesophageal Reflux Disease Hemorrhoids Hypercholesterolemia     Review of Systems Leighton Ruff MD; 04/16/5464 11:46 AM) General Present- Appetite Loss. Not Present- Chills, Fatigue, Fever, Night Sweats, Weight Gain and Weight Loss. Skin Not Present- Change in Wart/Mole, Dryness, Hives, Jaundice, New Lesions, Non-Healing Wounds, Rash and Ulcer. HEENT Present- Hearing Loss and Ringing in the Ears. Not Present- Earache, Hoarseness, Nose Bleed, Oral Ulcers, Seasonal Allergies, Sinus Pain, Sore Throat, Visual Disturbances, Wears glasses/contact lenses and Yellow Eyes. Respiratory Present- Snoring. Not Present- Bloody sputum, Chronic Cough, Difficulty Breathing and Wheezing. Breast Not Present- Breast Mass, Breast Pain, Nipple Discharge and Skin Changes. Cardiovascular Not Present- Chest Pain, Difficulty Breathing Lying Down, Leg Cramps, Palpitations, Rapid Heart Rate, Shortness of Breath and Swelling of Extremities. Gastrointestinal Present- Bloody Stool, Hemorrhoids and Indigestion. Not Present- Abdominal Pain, Bloating, Change in Bowel Habits, Chronic diarrhea, Constipation, Difficulty Swallowing, Excessive gas, Gets full quickly at meals, Nausea, Rectal Pain and Vomiting. Musculoskeletal Present- Back Pain. Not Present- Joint Pain, Joint Stiffness, Muscle Pain, Muscle Weakness and Swelling of Extremities. Neurological Not Present- Decreased Memory, Fainting, Headaches, Numbness, Seizures, Tingling, Tremor, Trouble walking and Weakness. Psychiatric Not Present- Anxiety, Bipolar, Change in Sleep Pattern, Depression, Fearful and Frequent crying. Endocrine Not Present- Cold Intolerance, Excessive Hunger, Hair Changes, Heat Intolerance and New  Diabetes. Hematology Not Present- Blood Thinners, Easy Bruising, Excessive bleeding, Gland problems, HIV and Persistent Infections. All other systems negative   Physical Exam Leighton Ruff MD; 6/81/2751 11:44 AM)  General Mental Status-Alert. General Appearance-Cooperative.  Rectal Anorectal Exam External - Note: Mildly edematous skin tags with a right anterior grade 3 internal hemorrhoid. Internal - normal  sphincter tone.    Assessment & Plan Leighton Ruff MD; 4/40/3474 11:45 AM)  PROLAPSED INTERNAL HEMORRHOIDS, GRADE 3 (Q59.5) Impression: 65 year old male who presents to the office for evaluation of hemorrhoid disease. He has underwent rubber band ligation on one occasion but has a persistently prolapse, grade 3 right anterior internal hemorrhoid. We discussed that rubber band ligation will most likely not work on this hemorrhoid and I recommended excision. I think we can just excise the one hemorrhoid column. I will evaluate the other 2 hemorrhoidal columns during surgery, but endoscopy pictures seem to show just the one that is inflamed. We could possibly perform a hemorrhoidal pexy if needed. We discussed the typical postoperative pain, bleeding and recovery times. We discussed that he will most likely need additional procedures in the future if he continues to strain during his daily activities. He also has a hernia that will need to be fixed at some point in the future, but the hemorrhoid appears to be the more pressing issue.

## 2021-03-04 NOTE — H&P (Signed)
The patient is a 65 year old male who presents with hemorrhoids. 65 year old male who presented to the office for evaluation for a hernia and was noted to have hemorrhoid disease as well. He was scheduled for follow-up with me. He states that he has had trouble with hemorrhoids for the past year. He describes bleeding and pain and drainage. He has been seen at [place] and colonoscopy was performed in December 2021. Several polyps were removed. Internal hemorrhoids were found. Rubber band placement was performed on a hemorrhoid, and he was scheduled for follow-up but did not complete this. Patient reports no constipation issues, but occasionally has to strain to have a bowel movement or urinate. He sits on the toilet for long periods of time. He also strains for work, as he is a Dealer.   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 Hydrocortisone (Perianal) (2.5% Cream, External) Active. Lisinopril (10MG  Tablet, Oral) Active. Sildenafil Citrate (100MG  Tablet, Oral) Active. Simvastatin (10MG  Tablet, Oral) Active. Plenvu (140GM For Solution, Oral) Active. Simvastatin (20MG  Tablet, Oral) Active. Medications Reconciled  Social History Leighton Ruff, MD; 5/70/1779 11:45 AM) Alcohol use Moderate alcohol use. Caffeine use Coffee. No drug use Tobacco use Never smoker.  Family History  Leighton Ruff, MD; 3/90/3009 11:45 AM) Arthritis Mother. Diabetes Mellitus Brother, Father. Heart Disease Father. Hypertension Father, Mother. Kidney Disease Father.  Other Problems Leighton Ruff, MD; 2/33/0076 11:45 AM) Back Pain Gastroesophageal Reflux Disease Hemorrhoids Hypercholesterolemia     Review of Systems Leighton Ruff MD; 02/09/3334 11:46 AM) General Present- Appetite Loss. Not Present- Chills, Fatigue, Fever, Night Sweats, Weight Gain and Weight Loss. Skin Not Present- Change in Wart/Mole, Dryness, Hives, Jaundice, New Lesions, Non-Healing Wounds, Rash and Ulcer. HEENT Present- Hearing Loss and Ringing in the Ears. Not Present- Earache, Hoarseness, Nose Bleed, Oral Ulcers, Seasonal Allergies, Sinus Pain, Sore Throat, Visual Disturbances, Wears glasses/contact lenses and Yellow Eyes. Respiratory Present- Snoring. Not Present- Bloody sputum, Chronic Cough, Difficulty Breathing and Wheezing. Breast Not Present- Breast Mass, Breast Pain, Nipple Discharge and Skin Changes. Cardiovascular Not Present- Chest Pain, Difficulty Breathing Lying Down, Leg Cramps, Palpitations, Rapid Heart Rate, Shortness of Breath and Swelling of Extremities. Gastrointestinal Present- Bloody Stool, Hemorrhoids and Indigestion. Not Present- Abdominal Pain, Bloating, Change in Bowel Habits, Chronic diarrhea, Constipation, Difficulty Swallowing, Excessive gas, Gets full quickly at meals, Nausea, Rectal Pain and Vomiting. Musculoskeletal Present- Back Pain. Not Present- Joint Pain, Joint Stiffness, Muscle Pain, Muscle Weakness and Swelling of Extremities. Neurological Not Present- Decreased Memory, Fainting, Headaches, Numbness, Seizures, Tingling, Tremor, Trouble walking and Weakness. Psychiatric Not Present- Anxiety, Bipolar, Change in Sleep Pattern, Depression, Fearful and Frequent crying. Endocrine Not Present- Cold Intolerance, Excessive Hunger, Hair Changes, Heat Intolerance and New  Diabetes. Hematology Not Present- Blood Thinners, Easy Bruising, Excessive bleeding, Gland problems, HIV and Persistent Infections. All other systems negative   Physical Exam Leighton Ruff MD; 4/56/2563 11:44 AM)  General Mental Status-Alert. General Appearance-Cooperative.  Rectal Anorectal Exam External - Note: Mildly edematous skin tags with a right anterior grade 3 internal hemorrhoid. Internal - normal  sphincter tone.    Assessment & Plan Leighton Ruff MD; 2/86/7519 11:45 AM)  PROLAPSED INTERNAL HEMORRHOIDS, GRADE 3 (W24.2) Impression: 65 year old male who presents to the office for evaluation of hemorrhoid disease. He has underwent rubber band ligation on one occasion but has a persistently prolapse, grade 3 right anterior internal hemorrhoid. We discussed that rubber band ligation will most likely not work on this hemorrhoid and I recommended excision. I think we can just excise the one hemorrhoid column. I will evaluate the other 2 hemorrhoidal columns during surgery, but endoscopy pictures seem to show just the one that is inflamed. We could possibly perform a hemorrhoidal pexy if needed. We discussed the typical postoperative pain, bleeding and recovery times. We discussed that he will most likely need additional procedures in the future if he continues to strain during his daily activities. He also has a hernia that will need to be fixed at some point in the future, but the hemorrhoid appears to be the more pressing issue.

## 2021-03-15 ENCOUNTER — Encounter (HOSPITAL_BASED_OUTPATIENT_CLINIC_OR_DEPARTMENT_OTHER): Payer: Self-pay | Admitting: General Surgery

## 2021-03-15 ENCOUNTER — Other Ambulatory Visit: Payer: Self-pay

## 2021-03-15 NOTE — Progress Notes (Signed)
Spoke w/ via phone for pre-op interview--- PT Lab needs dos----no               Lab results------ no COVID test ------ 03-18-2021  @ 1425 Arrive at ------- 1230 on 03-20-2021 NPO after MN NO Solid Food.  Clear liquids from MN until--- 1130 Med rec completed Medications to take morning of surgery ----- Zocor, Nexium Diabetic medication -----  n/a Patient instructed to bring photo id and insurance card day of surgery Patient aware to have Driver (ride ) / caregiver    for 24 hours after surgery -- wife, Juliann Pulse Patient Special Instructions ----- n/a Pre-Op special Istructions ----- n/a Patient verbalized understanding of instructions that were given at this phone interview. Patient denies shortness of breath, chest pain, fever, cough at this phone interview.

## 2021-03-18 ENCOUNTER — Other Ambulatory Visit (HOSPITAL_COMMUNITY)
Admission: RE | Admit: 2021-03-18 | Discharge: 2021-03-18 | Disposition: A | Discharge status: Home / Self Care | Payer: Federal, State, Local not specified - PPO | Source: Ambulatory Visit | Attending: General Surgery | Admitting: General Surgery

## 2021-03-18 DIAGNOSIS — Z20822 Contact with and (suspected) exposure to covid-19: Secondary | ICD-10-CM | POA: Insufficient documentation

## 2021-03-18 DIAGNOSIS — K642 Third degree hemorrhoids: Secondary | ICD-10-CM | POA: Diagnosis present

## 2021-03-18 DIAGNOSIS — Z01812 Encounter for preprocedural laboratory examination: Secondary | ICD-10-CM | POA: Insufficient documentation

## 2021-03-18 DIAGNOSIS — E7800 Pure hypercholesterolemia, unspecified: Secondary | ICD-10-CM | POA: Diagnosis not present

## 2021-03-18 DIAGNOSIS — Z8249 Family history of ischemic heart disease and other diseases of the circulatory system: Secondary | ICD-10-CM | POA: Diagnosis not present

## 2021-03-19 ENCOUNTER — Encounter: Payer: Federal, State, Local not specified - PPO | Admitting: Internal Medicine

## 2021-03-19 LAB — SARS CORONAVIRUS 2 (TAT 6-24 HRS): SARS Coronavirus 2: NEGATIVE

## 2021-03-20 ENCOUNTER — Ambulatory Visit (HOSPITAL_BASED_OUTPATIENT_CLINIC_OR_DEPARTMENT_OTHER): Payer: Federal, State, Local not specified - PPO | Admitting: Anesthesiology

## 2021-03-20 ENCOUNTER — Ambulatory Visit (HOSPITAL_BASED_OUTPATIENT_CLINIC_OR_DEPARTMENT_OTHER)
Admission: RE | Admit: 2021-03-20 | Discharge: 2021-03-20 | Disposition: A | Discharge status: Home / Self Care | Payer: Federal, State, Local not specified - PPO | Source: Ambulatory Visit | Attending: General Surgery | Admitting: General Surgery

## 2021-03-20 ENCOUNTER — Encounter (HOSPITAL_BASED_OUTPATIENT_CLINIC_OR_DEPARTMENT_OTHER)
Admission: RE | Disposition: A | Discharge status: Home / Self Care | Payer: Self-pay | Source: Ambulatory Visit | Attending: General Surgery

## 2021-03-20 ENCOUNTER — Encounter (HOSPITAL_BASED_OUTPATIENT_CLINIC_OR_DEPARTMENT_OTHER): Payer: Self-pay | Admitting: General Surgery

## 2021-03-20 DIAGNOSIS — Z20822 Contact with and (suspected) exposure to covid-19: Secondary | ICD-10-CM | POA: Insufficient documentation

## 2021-03-20 DIAGNOSIS — K642 Third degree hemorrhoids: Secondary | ICD-10-CM | POA: Insufficient documentation

## 2021-03-20 DIAGNOSIS — E7800 Pure hypercholesterolemia, unspecified: Secondary | ICD-10-CM | POA: Insufficient documentation

## 2021-03-20 DIAGNOSIS — Z8249 Family history of ischemic heart disease and other diseases of the circulatory system: Secondary | ICD-10-CM | POA: Insufficient documentation

## 2021-03-20 HISTORY — DX: Hyperlipidemia, unspecified: E78.5

## 2021-03-20 HISTORY — DX: Third degree hemorrhoids: K64.2

## 2021-03-20 HISTORY — DX: Umbilical hernia without obstruction or gangrene: K42.9

## 2021-03-20 HISTORY — DX: Personal history of COVID-19: Z86.16

## 2021-03-20 HISTORY — DX: Diaphragmatic hernia without obstruction or gangrene: K44.9

## 2021-03-20 HISTORY — DX: Personal history of colonic polyps: Z86.010

## 2021-03-20 HISTORY — DX: Personal history of adenomatous and serrated colon polyps: Z86.0101

## 2021-03-20 HISTORY — DX: Personal history of other diseases of the digestive system: Z87.19

## 2021-03-20 HISTORY — PX: EVALUATION UNDER ANESTHESIA WITH HEMORRHOIDECTOMY: SHX5624

## 2021-03-20 SURGERY — EXAM UNDER ANESTHESIA WITH HEMORRHOIDECTOMY
Anesthesia: Monitor Anesthesia Care | Site: Rectum

## 2021-03-20 MED ORDER — LIDOCAINE HCL (CARDIAC) PF 100 MG/5ML IV SOSY
PREFILLED_SYRINGE | INTRAVENOUS | Status: DC | PRN
  Administered 2021-03-20: 60 mg via INTRAVENOUS

## 2021-03-20 MED ORDER — OXYCODONE HCL 5 MG PO TABS: 5.0000 mg | ORAL_TABLET | ORAL | Status: DC | PRN

## 2021-03-20 MED ORDER — BUPIVACAINE-EPINEPHRINE 0.5% -1:200000 IJ SOLN
INTRAMUSCULAR | Status: DC | PRN
  Administered 2021-03-20: 30 mL

## 2021-03-20 MED ORDER — OXYCODONE HCL 5 MG/5ML PO SOLN: 5.0000 mg | Freq: Once | ORAL | Status: DC | PRN

## 2021-03-20 MED ORDER — MIDAZOLAM HCL 5 MG/5ML IJ SOLN
INTRAMUSCULAR | Status: DC | PRN
  Administered 2021-03-20 (×2): 1 mg via INTRAVENOUS

## 2021-03-20 MED ORDER — GLYCOPYRROLATE PF 0.2 MG/ML IJ SOSY
PREFILLED_SYRINGE | INTRAMUSCULAR | Status: AC
  Filled 2021-03-20: qty 1

## 2021-03-20 MED ORDER — ONDANSETRON HCL 4 MG/2ML IJ SOLN
INTRAMUSCULAR | Status: DC | PRN
  Administered 2021-03-20: 4 mg via INTRAVENOUS

## 2021-03-20 MED ORDER — DIAZEPAM 5 MG PO TABS: 5.0000 mg | ORAL_TABLET | Freq: Four times a day (QID) | ORAL | 0 refills | Status: AC | PRN

## 2021-03-20 MED ORDER — LIDOCAINE 2% (20 MG/ML) 5 ML SYRINGE
INTRAMUSCULAR | Status: AC
  Filled 2021-03-20: qty 5

## 2021-03-20 MED ORDER — ACETAMINOPHEN 325 MG PO TABS: 650.0000 mg | ORAL_TABLET | ORAL | Status: DC | PRN

## 2021-03-20 MED ORDER — SODIUM CHLORIDE 0.9% FLUSH: 3.0000 mL | INTRAVENOUS | Status: DC | PRN

## 2021-03-20 MED ORDER — PROMETHAZINE HCL 25 MG/ML IJ SOLN: 6.2500 mg | INTRAMUSCULAR | Status: DC | PRN

## 2021-03-20 MED ORDER — BUPIVACAINE LIPOSOME 1.3 % IJ SUSP
INTRAMUSCULAR | Status: DC | PRN
  Administered 2021-03-20: 20 mL

## 2021-03-20 MED ORDER — ACETAMINOPHEN 500 MG PO TABS
ORAL_TABLET | ORAL | Status: AC
  Filled 2021-03-20: qty 2

## 2021-03-20 MED ORDER — PROPOFOL 500 MG/50ML IV EMUL
INTRAVENOUS | Status: AC
  Filled 2021-03-20: qty 50

## 2021-03-20 MED ORDER — GLYCOPYRROLATE 0.2 MG/ML IJ SOLN
INTRAMUSCULAR | Status: DC | PRN
  Administered 2021-03-20: .2 mg via INTRAVENOUS

## 2021-03-20 MED ORDER — SODIUM CHLORIDE 0.9% FLUSH: 3.0000 mL | Freq: Two times a day (BID) | INTRAVENOUS | Status: DC

## 2021-03-20 MED ORDER — OXYCODONE HCL 5 MG PO TABS: 5.0000 mg | ORAL_TABLET | Freq: Four times a day (QID) | ORAL | 0 refills | Status: AC | PRN

## 2021-03-20 MED ORDER — MIDAZOLAM HCL 2 MG/2ML IJ SOLN
0.5000 mg | Freq: Once | INTRAMUSCULAR | Status: DC | PRN
Start: 2021-03-20 — End: 2021-03-20

## 2021-03-20 MED ORDER — BUPIVACAINE LIPOSOME 1.3 % IJ SUSP: 20.0000 mL | Freq: Once | INTRAMUSCULAR | Status: DC

## 2021-03-20 MED ORDER — OXYCODONE HCL 5 MG PO TABS
5.0000 mg | ORAL_TABLET | Freq: Once | ORAL | Status: DC | PRN
Start: 2021-03-20 — End: 2021-03-20

## 2021-03-20 MED ORDER — KETAMINE HCL 50 MG/5ML IJ SOSY
PREFILLED_SYRINGE | INTRAMUSCULAR | Status: AC
  Filled 2021-03-20: qty 5

## 2021-03-20 MED ORDER — LACTATED RINGERS IV SOLN: INTRAVENOUS | Status: DC

## 2021-03-20 MED ORDER — ACETAMINOPHEN 325 MG RE SUPP: 650.0000 mg | RECTAL | Status: DC | PRN

## 2021-03-20 MED ORDER — ACETAMINOPHEN 500 MG PO TABS
1000.0000 mg | ORAL_TABLET | ORAL | Status: AC
  Administered 2021-03-20: 1000 mg via ORAL

## 2021-03-20 MED ORDER — MEPERIDINE HCL 25 MG/ML IJ SOLN: 6.2500 mg | INTRAMUSCULAR | Status: DC | PRN

## 2021-03-20 MED ORDER — PROPOFOL 500 MG/50ML IV EMUL
INTRAVENOUS | Status: DC | PRN
  Administered 2021-03-20: 150 ug/kg/min via INTRAVENOUS

## 2021-03-20 MED ORDER — KETAMINE HCL 10 MG/ML IJ SOLN
INTRAMUSCULAR | Status: DC | PRN
  Administered 2021-03-20 (×2): 10 mg via INTRAVENOUS

## 2021-03-20 MED ORDER — SODIUM CHLORIDE 0.9 % IV SOLN: 250.0000 mL | INTRAVENOUS | Status: DC | PRN

## 2021-03-20 MED ORDER — FENTANYL CITRATE (PF) 100 MCG/2ML IJ SOLN: 25.0000 ug | INTRAMUSCULAR | Status: DC | PRN

## 2021-03-20 MED ORDER — DIAZEPAM 5 MG PO TABS
ORAL_TABLET | ORAL | Status: AC
  Filled 2021-03-20: qty 1

## 2021-03-20 MED ORDER — MIDAZOLAM HCL 2 MG/2ML IJ SOLN
INTRAMUSCULAR | Status: AC
  Filled 2021-03-20: qty 2

## 2021-03-20 MED ORDER — LIDOCAINE 5 % EX OINT
TOPICAL_OINTMENT | CUTANEOUS | Status: DC | PRN
  Administered 2021-03-20: 1

## 2021-03-20 MED ORDER — ONDANSETRON HCL 4 MG/2ML IJ SOLN
INTRAMUSCULAR | Status: AC
  Filled 2021-03-20: qty 2

## 2021-03-20 MED ORDER — PROPOFOL 10 MG/ML IV BOLUS
INTRAVENOUS | Status: DC | PRN
  Administered 2021-03-20: 20 mg via INTRAVENOUS

## 2021-03-20 MED ORDER — DIAZEPAM 5 MG PO TABS
5.0000 mg | ORAL_TABLET | Freq: Once | ORAL | Status: AC | PRN
  Administered 2021-03-20: 5 mg via ORAL

## 2021-03-20 SURGICAL SUPPLY — 39 items
BLADE EXTENDED COATED 6.5IN (ELECTRODE) IMPLANT
BLADE HEX COATED 2.75 (ELECTRODE) ×2 IMPLANT
BLADE SURG 10 STRL SS (BLADE) IMPLANT
BRIEF STRETCH FOR OB PAD LRG (UNDERPADS AND DIAPERS) IMPLANT
COVER BACK TABLE 60X90IN (DRAPES) ×2 IMPLANT
COVER MAYO STAND STRL (DRAPES) ×2 IMPLANT
COVER WAND RF STERILE (DRAPES) ×2 IMPLANT
DRAPE LAPAROTOMY 100X72 PEDS (DRAPES) ×2 IMPLANT
DRAPE UTILITY XL STRL (DRAPES) ×2 IMPLANT
DRSG PAD ABDOMINAL 8X10 ST (GAUZE/BANDAGES/DRESSINGS) ×2 IMPLANT
ELECT REM PT RETURN 9FT ADLT (ELECTROSURGICAL) ×2
ELECTRODE REM PT RTRN 9FT ADLT (ELECTROSURGICAL) ×1 IMPLANT
GAUZE SPONGE 4X4 12PLY STRL (GAUZE/BANDAGES/DRESSINGS) ×2 IMPLANT
GLOVE SURG ENC MOIS LTX SZ6.5 (GLOVE) ×2 IMPLANT
GLOVE SURG UNDER POLY LF SZ6.5 (GLOVE) ×2 IMPLANT
GLOVE SURG UNDER POLY LF SZ7 (GLOVE) ×2 IMPLANT
GLOVE SURG UNDER POLY LF SZ7.5 (GLOVE) ×2 IMPLANT
KIT SIGMOIDOSCOPE (SET/KITS/TRAYS/PACK) IMPLANT
KIT TURNOVER CYSTO (KITS) ×2 IMPLANT
NEEDLE HYPO 22GX1.5 SAFETY (NEEDLE) ×2 IMPLANT
NS IRRIG 500ML POUR BTL (IV SOLUTION) ×2 IMPLANT
PACK BASIN DAY SURGERY FS (CUSTOM PROCEDURE TRAY) ×2 IMPLANT
PAD ARMBOARD 7.5X6 YLW CONV (MISCELLANEOUS) IMPLANT
PENCIL SMOKE EVACUATOR (MISCELLANEOUS) ×2 IMPLANT
SPONGE SURGIFOAM ABS GEL 100 (HEMOSTASIS) IMPLANT
SPONGE SURGIFOAM ABS GEL 12-7 (HEMOSTASIS) IMPLANT
SUT CHROMIC 2 0 SH (SUTURE) ×2 IMPLANT
SUT CHROMIC 3 0 SH 27 (SUTURE) ×2 IMPLANT
SUT VIC AB 2-0 SH 27 (SUTURE)
SUT VIC AB 2-0 SH 27XBRD (SUTURE) IMPLANT
SUT VIC AB 3-0 SH 27 (SUTURE) ×2
SUT VIC AB 3-0 SH 27XBRD (SUTURE) ×1 IMPLANT
SUT VIC AB 4-0 SH 18 (SUTURE) IMPLANT
SYR CONTROL 10ML LL (SYRINGE) ×2 IMPLANT
TOWEL OR 17X26 10 PK STRL BLUE (TOWEL DISPOSABLE) ×2 IMPLANT
TRAY DSU PREP LF (CUSTOM PROCEDURE TRAY) ×2 IMPLANT
TUBE CONNECTING 12X1/4 (SUCTIONS) ×2 IMPLANT
WATER STERILE IRR 500ML POUR (IV SOLUTION) IMPLANT
YANKAUER SUCT BULB TIP NO VENT (SUCTIONS) ×2 IMPLANT

## 2021-03-20 NOTE — Anesthesia Preprocedure Evaluation (Addendum)
Anesthesia Evaluation  Patient identified by MRN, date of birth, ID band Patient awake    Reviewed: Allergy & Precautions, NPO status , Patient's Chart, lab work & pertinent test results  History of Anesthesia Complications Negative for: history of anesthetic complications  Airway Mallampati: I  TM Distance: >3 FB Neck ROM: Full    Dental  (+) Caps, Dental Advisory Given   Pulmonary neg pulmonary ROS,  12/2020 COVID positive 03/18/2021 SARS coronavirus NEG   breath sounds clear to auscultation       Cardiovascular hypertension, Pt. on medications (-) angina Rhythm:Regular Rate:Normal     Neuro/Psych negative neurological ROS     GI/Hepatic Neg liver ROS, hiatal hernia, GERD  Controlled and Medicated,  Endo/Other  negative endocrine ROS  Renal/GU negative Renal ROS     Musculoskeletal   Abdominal   Peds  Hematology negative hematology ROS (+)   Anesthesia Other Findings   Reproductive/Obstetrics                            Anesthesia Physical Anesthesia Plan  ASA: II  Anesthesia Plan: MAC   Post-op Pain Management:    Induction:   PONV Risk Score and Plan: 1 and Ondansetron  Airway Management Planned: Simple Face Mask and Natural Airway  Additional Equipment: None  Intra-op Plan:   Post-operative Plan:   Informed Consent: I have reviewed the patients History and Physical, chart, labs and discussed the procedure including the risks, benefits and alternatives for the proposed anesthesia with the patient or authorized representative who has indicated his/her understanding and acceptance.     Dental advisory given  Plan Discussed with: CRNA and Surgeon  Anesthesia Plan Comments:        Anesthesia Quick Evaluation

## 2021-03-20 NOTE — Transfer of Care (Signed)
Immediate Anesthesia Transfer of Care Note  Patient: Corey Berry  Procedure(s) Performed: Procedure(s) (LRB): EXAM UNDER ANESTHESIA WITH SINGLE COLUMN HEMORRHOIDECTOMY HEMORHOIDAL PEXY (N/A)  Patient Location: PACU  Anesthesia Type: MAC  Level of Consciousness: awake, alert  and oriented  Airway & Oxygen Therapy: Patient Spontanous Breathing and Patient connected to face mask oxygen  Post-op Assessment: Report given to PACU RN and Post -op Vital signs reviewed and stable  Post vital signs: Reviewed and stable  Complications: No apparent anesthesia complications Last Vitals:  Vitals Value Taken Time  BP 162/90 03/20/21 1416  Temp 36.8 C 03/20/21 1416  Pulse 89 03/20/21 1419  Resp 21 03/20/21 1419  SpO2 95 % 03/20/21 1419  Vitals shown include unvalidated device data.  Last Pain:  Vitals:   03/20/21 1306  TempSrc: Oral  PainSc: 0-No pain      Patients Stated Pain Goal: 3 (94/44/61 [id number])  Complications: No complications documented.

## 2021-03-20 NOTE — Anesthesia Procedure Notes (Signed)
Procedure Name: MAC Date/Time: 03/20/2021 1:45 PM Performed by: Mechele Claude, CRNA Pre-anesthesia Checklist: Patient identified, Emergency Drugs available, Suction available, Patient being monitored and Timeout performed Oxygen Delivery Method: Simple face mask Placement Confirmation: positive ETCO2,  CO2 detector and breath sounds checked- equal and bilateral

## 2021-03-20 NOTE — Interval H&P Note (Signed)
History and Physical Interval Note:  03/20/2021 1:22 PM  Corey Berry  has presented today for surgery, with the diagnosis of GRADE 3 HEMORRHOID.  The various methods of treatment have been discussed with the patient and family. After consideration of risks, benefits and other options for treatment, the patient has consented to  Procedure(s) with comments: EXAM UNDER ANESTHESIA WITH SINGLE COLUMN HEMORRHOIDECTOMY POSSIBLE HEMORHOIDAL PEXY (N/A) - 45/RM1 as a surgical intervention.  The patient's history has been reviewed, patient examined, no change in status, stable for surgery.  I have reviewed the patient's chart and labs.  Questions were answered to the patient's satisfaction.     Rosario Adie, MD  [facility]

## 2021-03-20 NOTE — Discharge Instructions (Addendum)
ANORECTAL SURGERY: POST OP INSTRUCTIONS 1. Take your usually prescribed home medications unless otherwise directed. 2. DIET: During the first few hours after surgery sip on some liquids until you are able to urinate.  It is normal to not urinate for several hours after this surgery.  If you feel uncomfortable, please contact the office for instructions.  After you are able to urinate,you may eat, if you feel like it.  Follow a light bland diet the first 24 hours after arrival home, such as soup, liquids, crackers, etc.  Be sure to include lots of fluids daily (6-8 glasses).  Avoid fast food or heavy meals, as your are more likely to get nauseated.  Eat a low fat diet the next few days after surgery.  Limit caffeine intake to 1-2 servings a day. 3. PAIN CONTROL: a. Pain is best controlled by a usual combination of several different methods TOGETHER: i. Muscle relaxation 1.  Soak in a warm bath (or Sitz bath) three times a day and after bowel movements.  Continue to do this until all pain is resolved. 2. Take the muscle relaxer (Valium) every 6 hours for the first 2 days after surgery  ii. Over the counter pain medication iii. Prescription pain medication b. Most patients will experience some swelling and discomfort in the anus/rectal area and incisions.  Heat such as warm towels, sitz baths, warm baths, etc to help relax tight/sore spots and speed recovery.  Some people prefer to use ice, especially in the first couple days after surgery, as it may decrease the pain and swelling, or alternate between ice & heat.  Experiment to what works for you.  Swelling and bruising can take several weeks to resolve.  Pain can take even longer to completely resolve. c. It is helpful to take an over-the-counter pain medication regularly for the first few weeks.  Choose one of the following that works best for you: i. Naproxen (Aleve, etc)  Two 220mg  tabs twice a day ii. Ibuprofen (Advil, etc) Three 200mg  tabs four  times a day (every meal & bedtime) d. A  prescription for pain medication (such as percocet, oxycodone, hydrocodone, etc) should be given to you upon discharge.  Take your pain medication as prescribed.  i. If you are having problems/concerns with the prescription medicine (does not control pain, nausea, vomiting, rash, itching, etc), please call us [phone] to see if we need to switch you to a different pain medicine that will work better for you and/or control your side effect better. ii. If you need a refill on your pain medication, please contact your pharmacy.  They will contact our office to request authorization. Prescriptions will not be filled after 5 pm or on week-ends. 4. KEEP YOUR BOWELS REGULAR and AVOID CONSTIPATION a. The goal is one to two soft bowel movements a day.  You should at least have a bowel movement every other day. b. Avoid getting constipated.  Between the surgery and the pain medications, it is common to experience some constipation. This can be very painful after rectal surgery.  Increasing fluid intake and taking a fiber supplement (such as Metamucil, Citrucel, FiberCon, etc) 1-2 times a day regularly will usually help prevent this problem from occurring.  A stool softener like colace is also recommended.  This can be purchased over the counter at your pharmacy.  You can take it up to 3 times a day.  If you do not have a bowel movement after 24 hrs since your surgery,  take one does of milk of magnesia.  If you still haven't had a bowel movement 8-12 hours after that dose, take another dose.  If you don't have a bowel movement 48 hrs after surgery, purchase a Fleets enema from the drug store and administer gently per package instructions.  If you still are having trouble with your bowel movements after that, please call the office for further instructions. c. If you develop diarrhea or have many loose bowel movements, simplify your diet to bland foods & liquids for a few  days.  Stop any stool softeners and decrease your fiber supplement.  Switching to mild anti-diarrheal medications (Kayopectate, Pepto Bismol) can help.  If this worsens or does not improve, please call us.  5. Wound Care a. Remove your bandages before your first bowel movement or 8 hours after surgery.     b. Remove any wound packing material at this tim,e as well.  You do not need to repack the wound unless instructed otherwise.  Wear an absorbent pad or soft cotton gauze in your underwear to catch any drainage and help keep the area clean. You should change this every 2-3 hours while awake. c. Keep the area clean and dry.  Bathe / shower every day, especially after bowel movements.  Keep the area clean by showering / bathing over the incision / wound.   It is okay to soak an open wound to help wash it.  Wet wipes or showers / gentle washing after bowel movements is often less traumatic than regular toilet paper. d. Dennis Bast may have some styrofoam-like soft packing in the rectum which will come out with the first bowel movement.  e. You will often notice bleeding with bowel movements.  This should slow down by the end of the first week of surgery f. Expect some drainage.  This should slow down, too, by the end of the first week of surgery.  Wear an absorbent pad or soft cotton gauze in your underwear until the drainage stops. g. Do Not sit on a rubber or pillow ring.  This can make you symptoms worse.  You may sit on a soft pillow if needed.  6. ACTIVITIES as tolerated:   a. You may resume regular (light) daily activities beginning the next day--such as daily self-care, walking, climbing stairs--gradually increasing activities as tolerated.  If you can walk 30 minutes without difficulty, it is safe to try more intense activity such as jogging, treadmill, bicycling, low-impact aerobics, swimming, etc. b. Save the most intensive and strenuous activity for last such as sit-ups, heavy lifting, contact sports,  etc  Refrain from any heavy lifting or straining until you are off narcotics for pain control.   c. You may drive when you are no longer taking prescription pain medication, you can comfortably sit for long periods of time, and you can safely maneuver your car and apply brakes. d. Dennis Bast may have sexual intercourse when it is comfortable.  7. FOLLOW UP in our office a. Please call [facility] at [phone] to set up an appointment to see your surgeon in the office for a follow-up appointment approximately 3-4 weeks after your surgery. b. Make sure that you call for this appointment the day you arrive home to insure a convenient appointment time. 10. IF YOU HAVE DISABILITY OR FAMILY LEAVE FORMS, BRING THEM TO THE OFFICE FOR PROCESSING.  DO NOT GIVE THEM TO YOUR DOCTOR.     WHEN TO CALL us [phone]: 1. Poor pain control  2. Reactions / problems with new medications (rash/itching, nausea, etc)  3. Fever over 101.5 F (38.5 C) 4. Inability to urinate 5. Nausea and/or vomiting 6. Worsening swelling or bruising 7. Continued bleeding from incision. 8. Increased pain, redness, or drainage from the incision  The clinic staff is available to answer your questions during regular business hours (8:30am-5pm).  Please don't hesitate to call and ask to speak to one of our nurses for clinical concerns.   A surgeon from [facility] is always on call at the hospitals   If you have a medical emergency, go to the nearest emergency room or call 911.    [facility], [address], [place], [place], [place]  [address] ? MAIN: [phone] ? TOLL FREE: [phone] ? FAX [phone] [email or web address]  Next dose of Tylenol after 7:15 PM today.  Information for Discharge Teaching: EXPAREL (bupivacaine liposome injectable suspension)   Your surgeon or anesthesiologist gave you EXPAREL(bupivacaine) to help control your pain after surgery.   EXPAREL is a  local anesthetic that provides pain relief by numbing the tissue around the surgical site.  EXPAREL is designed to release pain medication over time and can control pain for up to 72 hours.  Depending on how you respond to EXPAREL, you may require less pain medication during your recovery.  Possible side effects:  Temporary loss of sensation or ability to move in the area where bupivacaine was injected.  Nausea, vomiting, constipation  Rarely, numbness and tingling in your mouth or lips, lightheadedness, or anxiety may occur.  Call your doctor right away if you think you may be experiencing any of these sensations, or if you have other questions regarding possible side effects.  Follow all other discharge instructions given to you by your surgeon or nurse. Eat a healthy diet and drink plenty of water or other fluids.  If you return to the hospital for any reason within 96 hours following the administration of EXPAREL, it is important for health care providers to know that you have received this anesthetic. A teal colored band has been placed on your arm with the date, time and amount of EXPAREL you have received in order to alert and inform your health care providers. Please leave this armband in place for the full 96 hours following administration, and then you may remove the band. May remove Teal armband after Sunday, 4/10.   Post Anesthesia Home Care Instructions  Activity: Get plenty of rest for the remainder of the day. A responsible individual must stay with you for 24 hours following the procedure.  For the next 24 hours, DO NOT: -Drive a car -Paediatric nurse -Drink alcoholic beverages -Take any medication unless instructed by your physician -Make any legal decisions or sign important papers.  Meals: Start with liquid foods such as gelatin or soup. Progress to regular foods as tolerated. Avoid greasy, spicy, heavy foods. If nausea and/or vomiting occur, drink only clear  liquids until the nausea and/or vomiting subsides. Call your physician if vomiting continues.  Special Instructions/Symptoms: Your throat may feel dry or sore from the anesthesia or the breathing tube placed in your throat during surgery. If this causes discomfort, gargle with warm salt water. The discomfort should disappear within 24 hours.  If you had a scopolamine patch placed behind your ear for the management of post- operative nausea and/or vomiting:  1. The medication in the patch is effective for 72 hours, after which it should be removed.  Wrap patch in a tissue and discard in the trash. Wash hands thoroughly with soap and water. 2. You may remove the patch earlier than 72 hours if you experience unpleasant side effects which may include dry mouth, dizziness or visual disturbances. 3. Avoid touching the patch. Wash your hands with soap and water after contact with the patch.

## 2021-03-20 NOTE — Op Note (Signed)
03/20/2021  2:08 PM  PATIENT:  Corey Berry  65 y.o. male  Patient Care Team: Etter Sjogren, FNP as PCP - General (Family Medicine)  PRE-OPERATIVE DIAGNOSIS:  GRADE 3 HEMORRHOID  POST-OPERATIVE DIAGNOSIS:  GRADE 3 RIGHT ANTERIOR HEMORRHOID, GRADE 2 LEFT LATERAL HEMORRHOID  PROCEDURE:   EXAM UNDER ANESTHESIA WITH SINGLE COLUMN HEMORRHOIDECTOMY, HEMORHOIDAL PEXY   Surgeon(s): Leighton Ruff, MD  ASSISTANT: none   ANESTHESIA:   local and MAC  SPECIMEN:  Source of Specimen:  R anterior hemorrhoid  DISPOSITION OF SPECIMEN:  PATHOLOGY  COUNTS:  YES  PLAN OF CARE: Discharge to home after PACU  PATIENT DISPOSITION:  PACU - hemodynamically stable.  INDICATION: 64 year old male with grade 3 prolapsing hemorrhoid and rectal bleeding.  Colonoscopy shows no other source for his bleeding.  I have recommended hemorrhoidectomy as he has failed rubber band ligation in the past.   OR FINDINGS: Grade 3 right anterior internal hemorrhoid, grade 2 left lateral internal hemorrhoid  DESCRIPTION: the patient was identified in the preoperative holding area and taken to the OR where they were laid on the operating room table.  MAC anesthesia was induced without difficulty. The patient was then positioned in prone jackknife position with buttocks gently taped apart.  The patient was then prepped and draped in usual sterile fashion.  SCDs were noted to be in place prior to the initiation of anesthesia. A surgical timeout was performed indicating the correct patient, procedure, positioning and need for preoperative antibiotics.  A rectal block was performed using Marcaine with epinephrine mixed with Exparel.    I began with a digital rectal exam.  There were no obvious masses noted.  There was an inflammatory polyp noted on the right anterior hemorrhoid.  I then placed a Hill-Ferguson anoscope into the anal canal and evaluated this completely.  Patient had a grade 3 right anterior hemorrhoid, grade  1 right posterior hemorrhoid and a grade 2 left lateral hemorrhoid.  I elevated the right anterior external hemorrhoid with an Allis clamp.  I incised beneath this using Metzenbaum scissors.  I identified the sphincter complex and dissected this free from the overlying tissues.  I then divided the hemorrhoid off of the anal canal using blunt dissection and Metzenbaum scissors.  This was then sent to pathology for further examination.  I then closed the anal canal mucosa using a running 2-0 chromic suture.  The anoderm was reapproximated using a running 3-0 chromic running suture.  Hemostasis was good.  I decided to perform a hemorrhoidal pexy on the left lateral hemorrhoid.  This was done using a 3-0 Vicryl suture.  Once this was complete we inspected the entire anal canal for hemostasis.  This was good.  Additional Marcaine was placed at the incision site to help with postoperative pain control.  Lidocaine ointment and a dressing was then applied.  The patient was then awakened from anesthesia and sent to the postanesthesia care unit in stable condition.  All counts were correct per operating room staff.

## 2021-03-20 NOTE — Anesthesia Postprocedure Evaluation (Signed)
Anesthesia Post Note  Patient: Corey Berry  Procedure(s) Performed: EXAM UNDER ANESTHESIA WITH SINGLE COLUMN HEMORRHOIDECTOMY HEMORHOIDAL PEXY (N/A Rectum)     Patient location during evaluation: Phase II Anesthesia Type: MAC Level of consciousness: awake and alert, patient cooperative and oriented Pain management: pain level controlled Vital Signs Assessment: post-procedure vital signs reviewed and stable Respiratory status: nonlabored ventilation, spontaneous breathing and respiratory function stable Cardiovascular status: blood pressure returned to baseline and stable Postop Assessment: no apparent nausea or vomiting, able to ambulate and adequate PO intake Anesthetic complications: no   No complications documented.  Last Vitals:  Vitals:   03/20/21 1430 03/20/21 1445  BP: (!) 175/96 (!) 171/106  Pulse: 75 72  Resp: (!) 22 (!) 26  Temp:    SpO2: 95% 97%    Last Pain:  Vitals:   03/20/21 1445  TempSrc:   PainSc: 0-No pain                 [name],[name]

## 2021-03-21 ENCOUNTER — Encounter (HOSPITAL_BASED_OUTPATIENT_CLINIC_OR_DEPARTMENT_OTHER): Payer: Self-pay | Admitting: General Surgery

## 2021-03-21 LAB — SURGICAL PATHOLOGY

## 2021-12-24 IMAGING — CT CT ABD-PELV W/ CM
2 of 5 series · 15 of 46 positions shown, 17 images · IV contrast (APPLIED)
Comparison: None.

CLINICAL DATA: Periumbilical pain

EXAM:
CT ABDOMEN AND PELVIS WITH CONTRAST
TECHNIQUE: Multidetector CT imaging of the abdomen and pelvis was performed
using the standard protocol following bolus administration of
intravenous contrast. Oral contrast also administered.
CONTRAST:  100mL OMNIPAQUE IOHEXOL 300 MG/ML  SOLN

[Series 2: axial st · axial · 0.95mm/px · z∈[+789,+1224]mm · 12 of 101 slices shown, 14 images]
[im 7/101  soft-tissue]
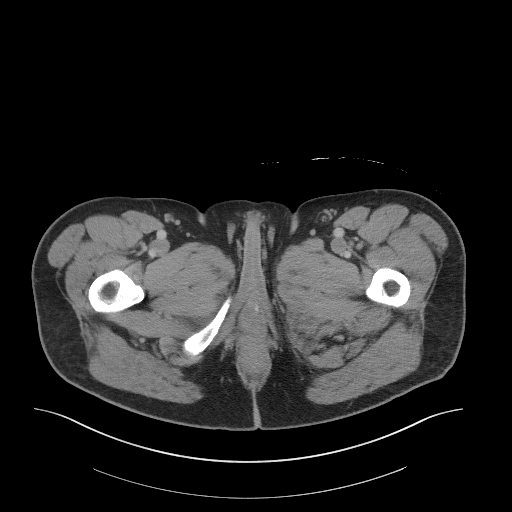
[im 7/101  bone]
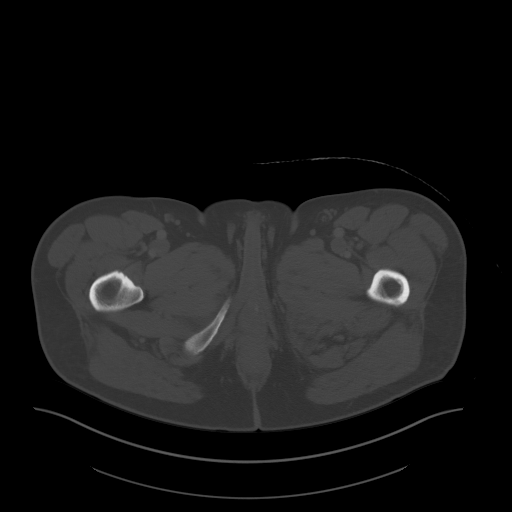
[im 14/101  soft-tissue]
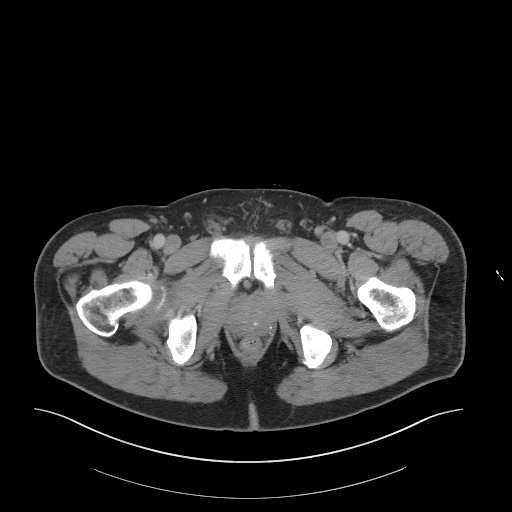
[im 21/101  soft-tissue]
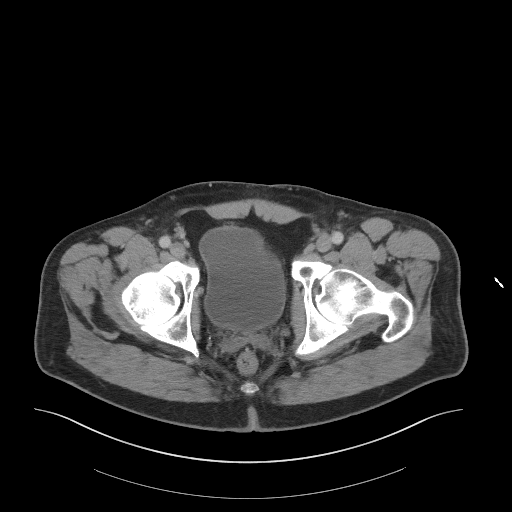
[im 34/101  soft-tissue]
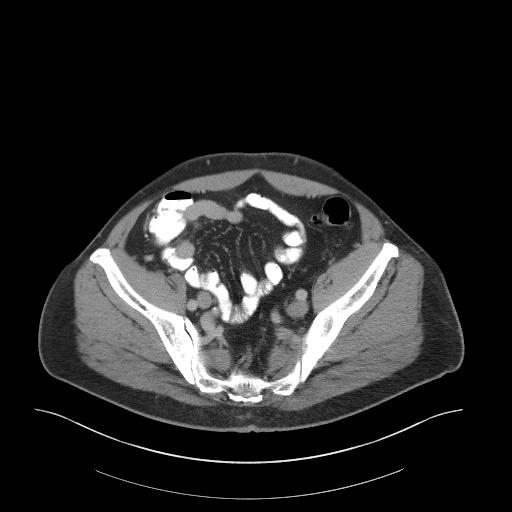
[im 41/101  soft-tissue]
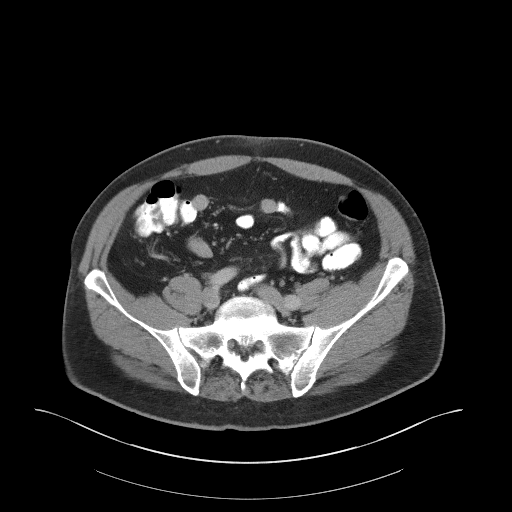
[im 47/101  soft-tissue]
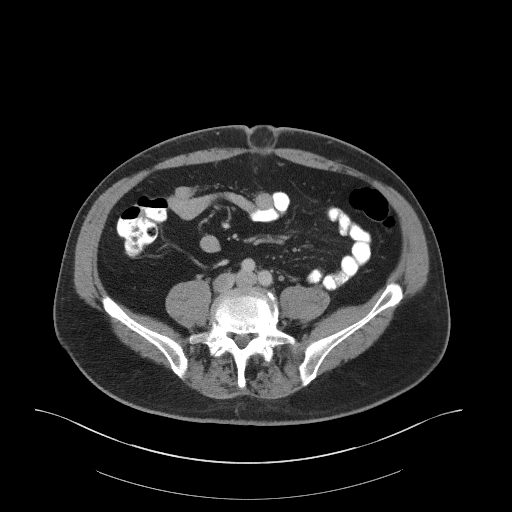
[im 54/101  soft-tissue]
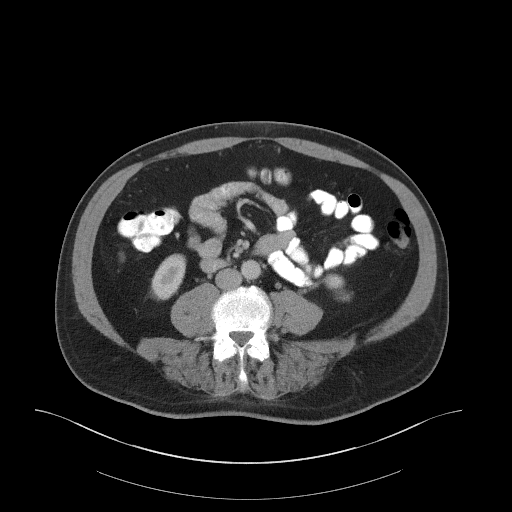
[im 61/101  soft-tissue]
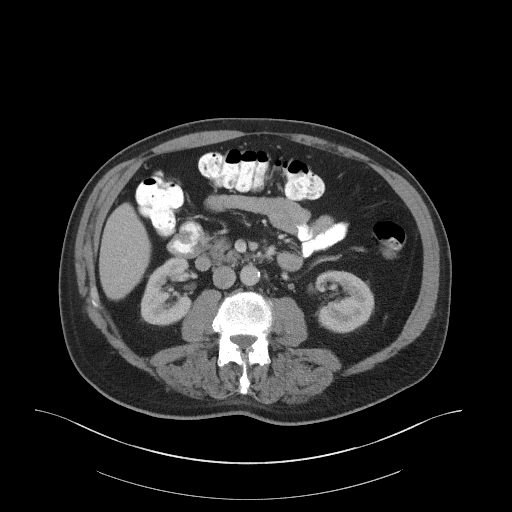
[im 67/101  soft-tissue]
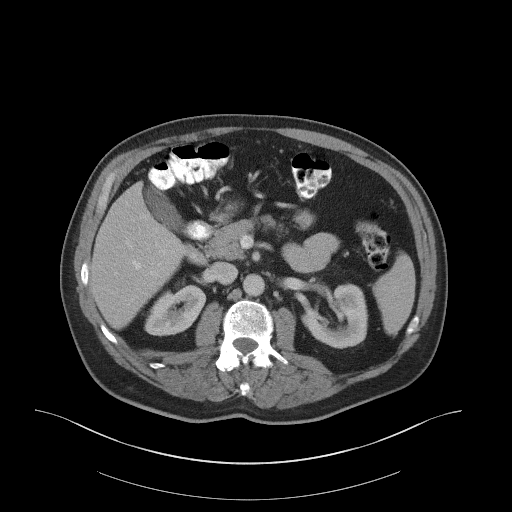
[im 67/101  bone]
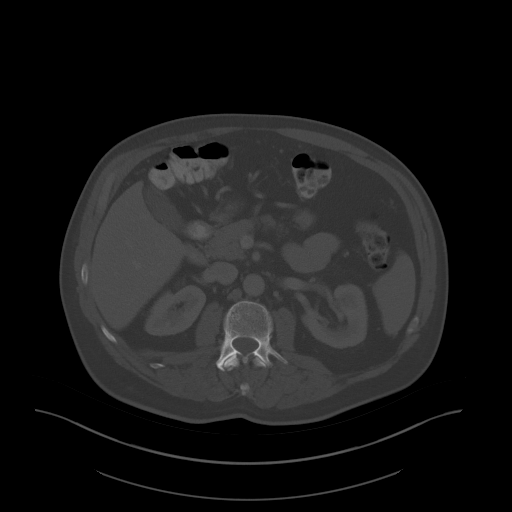
[im 81/101  soft-tissue]
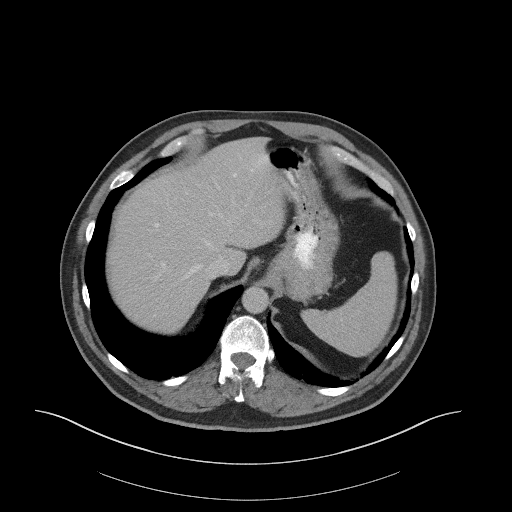
[im 87/101  soft-tissue]
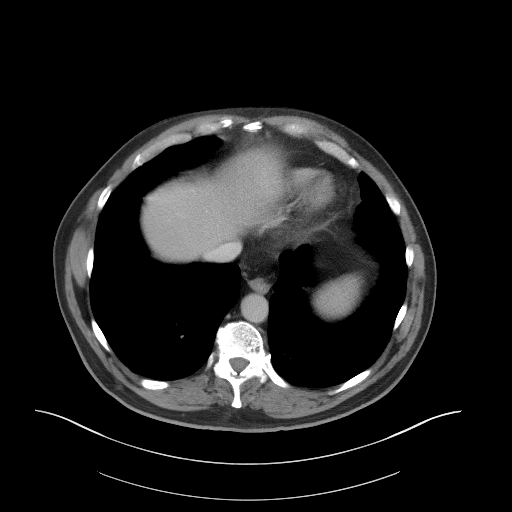
[im 94/101  soft-tissue]
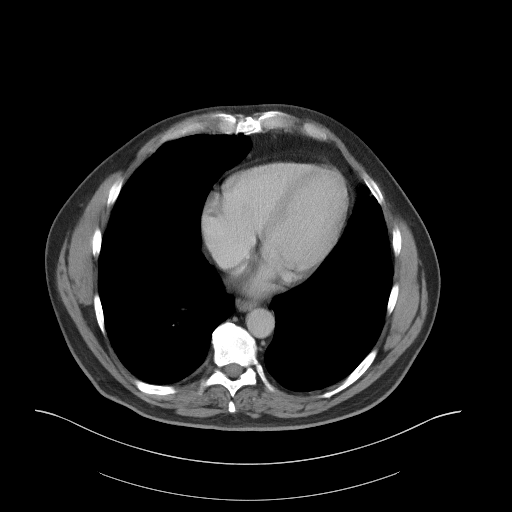

[Series 4: coronal st · coronal · 0.77mm/px · 3 of 105 slices shown]
[im 35/105  soft-tissue]
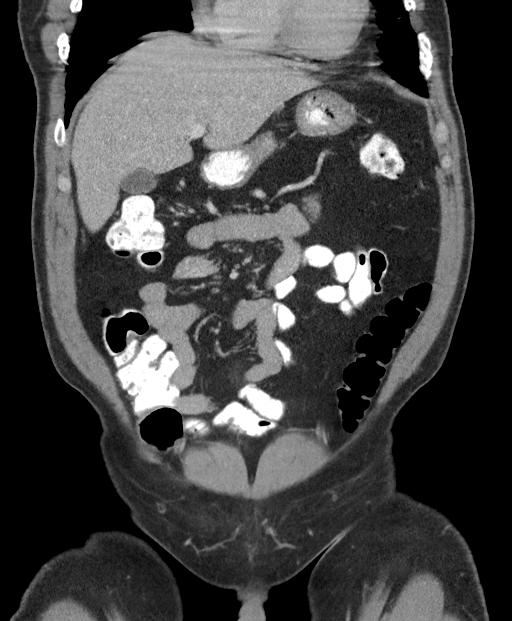
[im 47/105  soft-tissue]
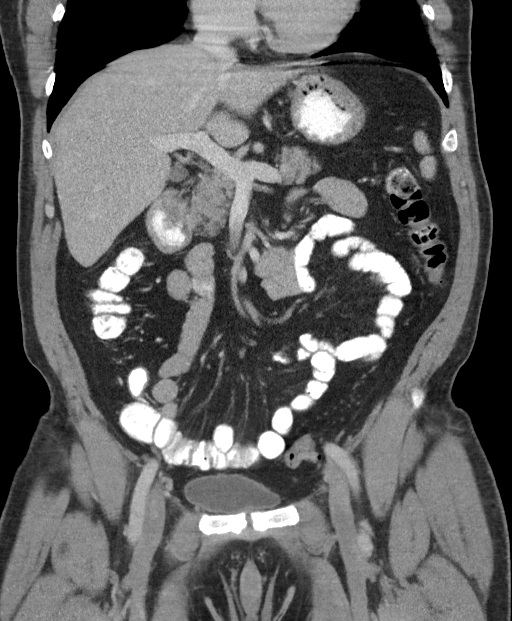
[im 58/105  soft-tissue]
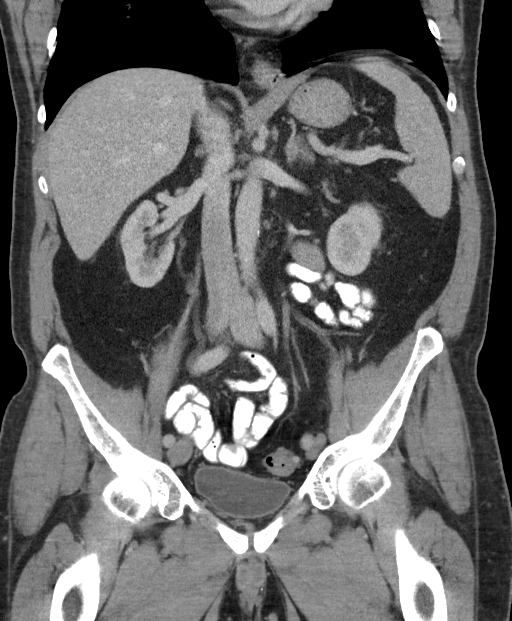

[15 of 46 positions shown; findings below may reference images not displayed]

FINDINGS: Lower chest: There is atelectatic change in the lung bases. No lung
base edema or consolidation.

Hepatobiliary: There is an apparent hemangioma in the right lobe of
the liver posteriorly measuring 2.5 x 1.8 cm. No other focal liver
lesions are evident. Gallbladder wall is not appreciably thickened.
There is no biliary duct dilatation.

Pancreas: There is no pancreatic mass or inflammatory focus.

Spleen: No splenic lesions are evident.

Adrenals/Urinary Tract: Adrenals bilaterally appear normal. No
evident renal mass or hydronephrosis on either side. There is no
appreciable renal or ureteral calculus on either side. Urinary
bladder is midline with wall thickness within normal limits.

Stomach/Bowel: There are sigmoid diverticula without diverticulitis.
There is no appreciable bowel wall or mesenteric thickening.
Terminal ileum appears normal. No bowel obstruction. No evident free
air or portal venous air.

Vascular/Lymphatic: There is no abdominal aortic aneurysm. There is
mild calcification in the aorta. Major venous structures appear
patent. No evident adenopathy in the abdomen or pelvis.

Reproductive: Occasional prostatic calculi evident. Prostate and
seminal vesicles normal in size and contour.

Other: There is an umbilical hernia which contains fat and slight
panniculitis but no bowel. This hernia at its neck measures 1.8 cm
from right to left dimension and 1.3 cm from superior to inferior
dimension. There is no bowel containing hernia evident on this
study. Appendix appears normal. There is no evident abscess or
ascites in the abdomen or pelvis.

Musculoskeletal: No blastic or lytic bone lesions evident. There is
a hemangioma in the posterior aspect of the T11 vertebral body. No
intramuscular lesions are evident.
IMPRESSION: 1. Umbilical hernia containing fat and slight panniculitis but no
bowel. Hernia at its neck measures 1.8 cm from right to left
dimension and 1.3 cm from superior to inferior dimension. Hernia
measures 3.3 cm from superior to inferior dimension, 2.4 cm from
anterior to posterior dimension, and 2.5 cm from right to left
dimension. No bowel containing hernia is evident.

2. There are sigmoid diverticula without diverticulitis. No bowel
wall thickening or bowel obstruction. No abscess in the abdomen or
pelvis. Appendix appears normal.

3. No renal or ureteral calculus. No hydronephrosis. Urinary bladder
wall thickness normal.

4.  Aortic Atherosclerosis ([id number]).
